# Patient Record
Sex: Male | Born: 1935 | Race: White | Hispanic: No | Marital: Married | Smoking: Never smoker
Health system: Southern US, Community
[De-identification: ages and names within clinical notes are randomized; demographics above are authoritative.]

## PROBLEM LIST (undated history)

## (undated) DIAGNOSIS — K219 Gastro-esophageal reflux disease without esophagitis: Secondary | ICD-10-CM

## (undated) DIAGNOSIS — E039 Hypothyroidism, unspecified: Secondary | ICD-10-CM

## (undated) DIAGNOSIS — I219 Acute myocardial infarction, unspecified: Secondary | ICD-10-CM

## (undated) DIAGNOSIS — E785 Hyperlipidemia, unspecified: Secondary | ICD-10-CM

## (undated) DIAGNOSIS — I2510 Atherosclerotic heart disease of native coronary artery without angina pectoris: Secondary | ICD-10-CM

## (undated) DIAGNOSIS — I10 Essential (primary) hypertension: Secondary | ICD-10-CM

## (undated) DIAGNOSIS — Z8551 Personal history of malignant neoplasm of bladder: Secondary | ICD-10-CM

## (undated) HISTORY — DX: Essential (primary) hypertension: I10

## (undated) HISTORY — PX: CYSTOURETHROSCOPY: SHX476

## (undated) HISTORY — DX: Gastro-esophageal reflux disease without esophagitis: K21.9

## (undated) HISTORY — PX: BLADDER SURGERY: SHX569

## (undated) HISTORY — DX: Atherosclerotic heart disease of native coronary artery without angina pectoris: I25.10

## (undated) HISTORY — DX: Acute myocardial infarction, unspecified: I21.9

## (undated) HISTORY — DX: Personal history of malignant neoplasm of bladder: Z85.51

## (undated) HISTORY — PX: LIVER BIOPSY: SHX301

## (undated) HISTORY — DX: Hypothyroidism, unspecified: E03.9

## (undated) HISTORY — PX: COLONOSCOPY: SHX174

## (undated) HISTORY — PX: CORONARY STENT PLACEMENT: SHX1402

## (undated) HISTORY — PX: TONSILLECTOMY: SHX5217

## (undated) HISTORY — DX: Hyperlipidemia, unspecified: E78.5

## (undated) HISTORY — PX: ANKLE SURGERY: SHX546

---

## 1998-06-13 ENCOUNTER — Ambulatory Visit (HOSPITAL_COMMUNITY): Admission: RE | Admit: 1998-06-13 | Discharge: 1998-06-13 | Payer: Self-pay | Admitting: Neurology

## 1998-06-13 ENCOUNTER — Encounter: Payer: Self-pay | Admitting: Neurology

## 1999-05-22 ENCOUNTER — Emergency Department (HOSPITAL_COMMUNITY): Admission: EM | Admit: 1999-05-22 | Discharge: 1999-05-22 | Payer: Self-pay | Admitting: Emergency Medicine

## 1999-05-22 ENCOUNTER — Encounter: Payer: Self-pay | Admitting: Emergency Medicine

## 2000-02-04 ENCOUNTER — Ambulatory Visit (HOSPITAL_COMMUNITY): Admission: RE | Admit: 2000-02-04 | Discharge: 2000-02-04 | Payer: Self-pay | Admitting: Internal Medicine

## 2001-02-26 ENCOUNTER — Ambulatory Visit (HOSPITAL_COMMUNITY): Admission: RE | Admit: 2001-02-26 | Discharge: 2001-02-26 | Payer: Self-pay | Admitting: Gastroenterology

## 2001-02-26 ENCOUNTER — Encounter: Payer: Self-pay | Admitting: Gastroenterology

## 2001-03-29 ENCOUNTER — Encounter: Payer: Self-pay | Admitting: Gastroenterology

## 2001-03-29 ENCOUNTER — Encounter (INDEPENDENT_AMBULATORY_CARE_PROVIDER_SITE_OTHER): Payer: Self-pay | Admitting: Specialist

## 2001-03-29 ENCOUNTER — Ambulatory Visit (HOSPITAL_COMMUNITY): Admission: RE | Admit: 2001-03-29 | Discharge: 2001-03-29 | Payer: Self-pay | Admitting: Gastroenterology

## 2001-04-08 ENCOUNTER — Emergency Department (HOSPITAL_COMMUNITY): Admission: EM | Admit: 2001-04-08 | Discharge: 2001-04-08 | Payer: Self-pay | Admitting: Emergency Medicine

## 2003-04-26 ENCOUNTER — Inpatient Hospital Stay (HOSPITAL_COMMUNITY): Admission: EM | Admit: 2003-04-26 | Discharge: 2003-04-29 | Payer: Self-pay | Admitting: Emergency Medicine

## 2003-11-09 ENCOUNTER — Ambulatory Visit (HOSPITAL_COMMUNITY): Admission: RE | Admit: 2003-11-09 | Discharge: 2003-11-09 | Payer: Self-pay | Admitting: Urology

## 2003-11-09 ENCOUNTER — Encounter (INDEPENDENT_AMBULATORY_CARE_PROVIDER_SITE_OTHER): Payer: Self-pay | Admitting: Specialist

## 2007-05-04 ENCOUNTER — Ambulatory Visit (HOSPITAL_COMMUNITY): Admission: RE | Admit: 2007-05-04 | Discharge: 2007-05-04 | Payer: Self-pay | Admitting: Internal Medicine

## 2007-05-05 ENCOUNTER — Inpatient Hospital Stay (HOSPITAL_COMMUNITY): Admission: EM | Admit: 2007-05-05 | Discharge: 2007-05-11 | Payer: Self-pay | Admitting: Cardiology

## 2007-05-05 ENCOUNTER — Ambulatory Visit: Payer: Self-pay | Admitting: Cardiology

## 2007-05-27 ENCOUNTER — Encounter (HOSPITAL_COMMUNITY): Admission: RE | Admit: 2007-05-27 | Discharge: 2007-08-25 | Payer: Self-pay | Admitting: Cardiology

## 2007-05-28 ENCOUNTER — Ambulatory Visit: Payer: Self-pay | Admitting: Cardiology

## 2007-06-30 ENCOUNTER — Ambulatory Visit: Payer: Self-pay | Admitting: Cardiology

## 2007-10-19 ENCOUNTER — Ambulatory Visit: Payer: Self-pay | Admitting: Cardiology

## 2007-10-22 ENCOUNTER — Ambulatory Visit: Payer: Self-pay

## 2007-10-22 LAB — CONVERTED CEMR LAB
ALT: 23 units/L (ref 0–53)
AST: 20 units/L (ref 0–37)
Albumin: 3.7 g/dL (ref 3.5–5.2)
Alkaline Phosphatase: 82 units/L (ref 39–117)
Bilirubin, Direct: 0.1 mg/dL (ref 0.0–0.3)
Cholesterol: 149 mg/dL (ref 0–200)
HDL: 41 mg/dL (ref >39.0)
LDL Cholesterol: 83 mg/dL (ref 0–99)
Total Bilirubin: 0.8 mg/dL (ref 0.3–1.2)
Total CHOL/HDL Ratio: 3.6
Total Protein: 6.6 g/dL (ref 6.0–8.3)
Triglycerides: 124 mg/dL (ref 0–149)
VLDL: 25 mg/dL (ref 0–40)

## 2007-11-02 ENCOUNTER — Ambulatory Visit: Payer: Self-pay | Admitting: Cardiology

## 2007-11-03 ENCOUNTER — Ambulatory Visit: Payer: Self-pay | Admitting: Cardiology

## 2007-11-23 ENCOUNTER — Ambulatory Visit: Payer: Self-pay | Admitting: Cardiology

## 2008-02-09 ENCOUNTER — Observation Stay (HOSPITAL_COMMUNITY): Admission: EM | Admit: 2008-02-09 | Discharge: 2008-02-10 | Payer: Self-pay | Admitting: Emergency Medicine

## 2008-02-09 ENCOUNTER — Ambulatory Visit: Payer: Self-pay | Admitting: Internal Medicine

## 2008-02-11 ENCOUNTER — Ambulatory Visit: Payer: Self-pay

## 2008-02-23 ENCOUNTER — Ambulatory Visit: Payer: Self-pay | Admitting: Cardiology

## 2008-04-10 ENCOUNTER — Ambulatory Visit: Payer: Self-pay | Admitting: Cardiology

## 2008-07-25 ENCOUNTER — Encounter: Admission: RE | Admit: 2008-07-25 | Discharge: 2008-07-25 | Payer: Self-pay | Admitting: Occupational Medicine

## 2008-09-27 ENCOUNTER — Ambulatory Visit: Payer: Self-pay | Admitting: Cardiology

## 2008-09-27 DIAGNOSIS — Z8551 Personal history of malignant neoplasm of bladder: Secondary | ICD-10-CM

## 2008-09-27 DIAGNOSIS — E039 Hypothyroidism, unspecified: Secondary | ICD-10-CM | POA: Insufficient documentation

## 2008-09-27 DIAGNOSIS — J45909 Unspecified asthma, uncomplicated: Secondary | ICD-10-CM | POA: Insufficient documentation

## 2008-09-27 DIAGNOSIS — I2510 Atherosclerotic heart disease of native coronary artery without angina pectoris: Secondary | ICD-10-CM | POA: Insufficient documentation

## 2008-09-27 DIAGNOSIS — K219 Gastro-esophageal reflux disease without esophagitis: Secondary | ICD-10-CM | POA: Insufficient documentation

## 2008-09-27 DIAGNOSIS — I10 Essential (primary) hypertension: Secondary | ICD-10-CM | POA: Insufficient documentation

## 2008-09-27 DIAGNOSIS — E785 Hyperlipidemia, unspecified: Secondary | ICD-10-CM

## 2008-10-02 ENCOUNTER — Telehealth: Payer: Self-pay | Admitting: Cardiology

## 2008-12-28 ENCOUNTER — Observation Stay (HOSPITAL_COMMUNITY): Admission: EM | Admit: 2008-12-28 | Discharge: 2008-12-29 | Payer: Self-pay | Admitting: Emergency Medicine

## 2008-12-28 ENCOUNTER — Telehealth: Payer: Self-pay | Admitting: Cardiology

## 2008-12-28 ENCOUNTER — Ambulatory Visit: Payer: Self-pay | Admitting: Internal Medicine

## 2009-01-08 ENCOUNTER — Ambulatory Visit: Payer: Self-pay | Admitting: Cardiology

## 2009-01-09 ENCOUNTER — Ambulatory Visit: Payer: Self-pay | Admitting: Cardiology

## 2009-01-11 LAB — CONVERTED CEMR LAB
ALT: 60 units/L — ABNORMAL HIGH (ref 0–53)
AST: 23 units/L (ref 0–37)
Albumin: 3.9 g/dL (ref 3.5–5.2)
Alkaline Phosphatase: 90 units/L (ref 39–117)
Bilirubin, Direct: 0 mg/dL (ref 0.0–0.3)
Cholesterol: 153 mg/dL (ref 0–200)
HDL: 46.1 mg/dL (ref >39.00)
LDL Cholesterol: 82 mg/dL (ref 0–99)
Total Bilirubin: 1 mg/dL (ref 0.3–1.2)
Total CHOL/HDL Ratio: 3
Total Protein: 7.1 g/dL (ref 6.0–8.3)
Triglycerides: 123 mg/dL (ref 0.0–149.0)
VLDL: 24.6 mg/dL (ref 0.0–40.0)

## 2009-02-05 ENCOUNTER — Telehealth: Payer: Self-pay | Admitting: Cardiology

## 2009-03-07 ENCOUNTER — Encounter (INDEPENDENT_AMBULATORY_CARE_PROVIDER_SITE_OTHER): Payer: Self-pay | Admitting: *Deleted

## 2009-03-14 ENCOUNTER — Ambulatory Visit: Payer: Self-pay | Admitting: Cardiology

## 2009-05-07 ENCOUNTER — Telehealth: Payer: Self-pay | Admitting: Cardiology

## 2009-05-16 ENCOUNTER — Encounter: Payer: Self-pay | Admitting: Cardiology

## 2009-07-30 ENCOUNTER — Emergency Department (HOSPITAL_COMMUNITY): Admission: EM | Admit: 2009-07-30 | Discharge: 2009-07-30 | Payer: Self-pay | Admitting: Family Medicine

## 2009-09-06 ENCOUNTER — Ambulatory Visit: Payer: Self-pay | Admitting: Cardiology

## 2010-03-20 ENCOUNTER — Ambulatory Visit: Payer: Self-pay | Admitting: Cardiology

## 2010-05-30 NOTE — Progress Notes (Signed)
Summary: speak to nurse about meds  Phone Note Call from Patient Call back at Home Phone [phone]   Caller: Patient Reason for Call: Talk to Nurse Summary of Call: request to speak to nurse about meds Initial call taken by: Migdalia Dk,  May 07, 2009 1:10 PM  Follow-up for Phone Call        pt is having a lot of trouble w/a bleeding hemroid and needs to know if its ok to hold plavix for a few days, he saw MD on Fri and they told him they can't do anything while he is on Plavix, he has a follow-up appt on Wed, pt w/DES in Feb 2009, will discuss w/Dr Tenny Craw and let pt know Meredith Staggers, RN  May 07, 2009 1:21 PM   Additional Follow-up for Phone Call Additional follow up Details #1::        Discussed w/Dr Tenny Craw, ok for pt to hold Plavix for a few days, pt is aware Meredith Staggers, RN  May 07, 2009 2:09 PM      Appended Document: speak to nurse about meds Spoke with wife in detail.  Dr. Carolynne Edouard is planning to contact us.  He was injected yesterday.  He has remained on ASA and understands not to stop both.  He has mulitple stents, and reason for DAPT study, etc., discussed.  She noted that he will call.  Based on the above note, he has held plavix.

## 2010-05-30 NOTE — Letter (Signed)
Summary: [facility] Office Note  [facility] Office Note   Imported By: Roderic Ovens 10/18/2009 10:00:56  _____________________________________________________________________  External Attachment:    Type:   Image     Comment:   External Document

## 2010-05-30 NOTE — Assessment & Plan Note (Addendum)
Summary: f4m   Visit Type:  Follow-up Primary Provider:  Dr. Jacky Kindle   History of Present Illness: 6 mth fu no cardiology complaints.  Had some congestion and cough.  Denies and chest pain, and isstaying active, and he is working up to 6 hours per day at the [employer].  No bleeding issues at present.   Current Medications (verified): 1)  Synthroid 50 Mcg Tabs (Levothyroxine Sodium) .... Take 1 Tablet By Mouth Once A Day 2)  Plavix 75 Mg Tabs (Clopidogrel Bisulfate) .... Take One Tablet By Mouth Daily 3)  Metoprolol Succinate 25 Mg Xr24h-Tab (Metoprolol Succinate) .... Take 1/2 Tablet By Mouth Two Times A Day 4)  Nasonex 50 Mcg/act Susp (Mometasone Furoate) .... As Needed 5)  Lipitor 40 Mg Tabs (Atorvastatin Calcium) .... Take One Tablet By Mouth Daily. 6)  Aspirin 81 Mg Tbec (Aspirin) .... Take One Tablet By Mouth Daily 7)  Nitroglycerin 0.4 Mg Subl (Nitroglycerin) .... One Tablet Under Tongue Every 5 Minutes As Needed For Chest Pain---May Repeat Times Three 8)  Ra Fish Oil 1000 Mg Caps (Omega-3 Fatty Acids) .... Take 1 Capsule By Mouth Once A Day 9)  Coq10 200 Mg Caps (Coenzyme Q10) .... Take 1 Capsule By Mouth Once A Day 10)  Lecithin 1200 Mg Caps (Lecithin) .... Every Other Day 11)  Multivitamins  Tabs (Multiple Vitamin) .... Take 1 Tablet By Mouth Once A Day 12)  Prilosec 20 Mg Cpdr (Omeprazole) .... Take 1 Tab Daily  Allergies (verified): No Known Drug Allergies  Comments:  Nurse/Medical Assistant: patient and i reviewed med list and stated all meds are the same we removed  pepcid and added omeprazole 20 mg daily  Past History:  Past Medical History: Last updated: 09/27/2008 CAD (ICD-414.00)-Post non-ST elevation myocardial infarction in january 2009 with stenting of the right coronary artery and drug- eluting stents placed in the left anterior descending. HYPERTENSION (ICD-401.9) HYPERLIPIDEMIA (ICD-272.4) GASTROESOPHAGEAL REFLUX DISEASE (ICD-530.81) HYPOTHYROIDISM  (ICD-244.9) NEOPLASM, MALIGNANT, BLADDER, HX OF (ICD-V10.51) ASTHMA (ICD-493.90)  Past Surgical History: Last updated: 09/27/2008  stenting of the right coronary artery and left anterior descending.   bladder surgery.   Liver biopsy.  Open reduction and internal fixation left ankle  Cystourethroscopy  tonsillectomy  Family History: Last updated: 09/27/2008  Both of his parents were in their mid 66s when they   died, neither one had heart disease.  He does have 1 brother who   developed heart disease after the age of 38.      Social History: Last updated: 09/27/2008  He lives in [place] with his wife and son.  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 He has no history of alcohol, tobacco or drug abuse.     Vital Signs:  Patient profile:   77 year old male Weight:      189 pounds BMI:     27.22 Pulse rate:   66 / minute BP sitting:   124 / 78  (right arm)  Vitals Entered By: Dreama Saa, CNA (March 20, 2010 3:17 PM)  Physical Exam  General:  Well developed, well nourished, in no acute distress. Head:  normocephalic and atraumatic Eyes:  PERRLA/EOM intact; conjunctiva and lids normal. Lungs:  Clear bilaterally to auscultation and percussion. Heart:  PMI non displaced.  Normal S1 and S2.  No murmur. Abdomen:  Bowel sounds positive; abdomen soft and non-tender without masses, organomegaly, or hernias noted. No hepatosplenomegaly. Pulses:  pulses normal in all 4 extremities Extremities:  No clubbing or cyanosis. Neurologic:  Alert and oriented x 3.   EKG  Procedure date:  03/20/2010  Findings:      NSR.  WNL.    Impression & Recommendations:  Problem # 1:  CAD (ICD-414.00) currently stable.  Remains on DAPT without difficulty.  Would not make changes at present time.  Will need to review use of omeprazole.  His updated medication list for this problem includes:    Plavix 75 Mg Tabs (Clopidogrel  bisulfate) .Marland Kitchen... Take one tablet by mouth daily    Metoprolol Succinate 25 Mg Xr24h-tab (Metoprolol succinate) .Marland Kitchen... Take 1/2 tablet by mouth two times a day    Aspirin 81 Mg Tbec (Aspirin) .Marland Kitchen... Take one tablet by mouth daily    Nitroglycerin 0.4 Mg Subl (Nitroglycerin) ..... One tablet under tongue every 5 minutes as needed for chest pain---may repeat times three  Problem # 2:  HYPERLIPIDEMIA (ICD-272.4) remains on lipid, lowering therapy. His updated medication list for this problem includes:    Lipitor 40 Mg Tabs (Atorvastatin calcium) .Marland Kitchen... Take one tablet by mouth daily.  Problem # 3:  HYPERTENSION (ICD-401.9) controlled at present.  His updated medication list for this problem includes:    Metoprolol Succinate 25 Mg Xr24h-tab (Metoprolol succinate) .Marland Kitchen... Take 1/2 tablet by mouth two times a day    Aspirin 81 Mg Tbec (Aspirin) .Marland Kitchen... Take one tablet by mouth daily  Patient Instructions: 1)  Your physician wants you to follow-up in:ONE YEAR   You will receive a reminder letter in the mail two months in advance. If you don't receive a letter, please call our office to schedule the follow-up appointment.  Appended Document: f4m ECG NSR, WNL.

## 2010-05-30 NOTE — Assessment & Plan Note (Signed)
Summary: 6 mo f/u   Visit Type:  6 months follow up Primary Provider:  Dr. Jacky Kindle  CC:  No complains.  History of Present Illness: Still working and doing well at present.  No chest pain.    Current Medications (verified): 1)  Synthroid 50 Mcg Tabs (Levothyroxine Sodium) .... Take 1 Tablet By Mouth Once A Day 2)  Plavix 75 Mg Tabs (Clopidogrel Bisulfate) .... Take One Tablet By Mouth Daily 3)  Metoprolol Succinate 25 Mg Xr24h-Tab (Metoprolol Succinate) .... Take 1/2 Tablet By Mouth Two Times A Day 4)  Nasonex 50 Mcg/act Susp (Mometasone Furoate) .... As Needed 5)  Lipitor 40 Mg Tabs (Atorvastatin Calcium) .... Take One Tablet By Mouth Daily. 6)  Aspirin 81 Mg Tbec (Aspirin) .... Take One Tablet By Mouth Daily 7)  Nitroglycerin 0.4 Mg Subl (Nitroglycerin) .... One Tablet Under Tongue Every 5 Minutes As Needed For Chest Pain---May Repeat Times Three 8)  Pepcid Complete 10-800-165 Mg Chew (Famotidine-Ca Carb-Mag Hydrox) .... As Needed 9)  Ra Fish Oil 1000 Mg Caps (Omega-3 Fatty Acids) .... Take 1 Capsule By Mouth Once A Day 10)  Coq10 200 Mg Caps (Coenzyme Q10) .... Take 1 Capsule By Mouth Once A Day 11)  Lecithin 1200 Mg Caps (Lecithin) .... Every Other Day 12)  Multivitamins  Tabs (Multiple Vitamin) .... Take 1 Tablet By Mouth Once A Day  Allergies (verified): No Known Drug Allergies  Vital Signs:  Patient profile:   77 year old male Height:      70 inches Weight:      188 pounds BMI:     27.07 Pulse rate:   60 / minute Pulse rhythm:   regular Resp:     18 per minute BP sitting:   131 / 77  (left arm) Cuff size:   large  Vitals Entered By: Vikki Ports (Sep 06, 2009 3:48 PM)  Physical Exam  General:  Well developed, well nourished, in no acute distress. Head:  normocephalic and atraumatic Eyes:  PERRLA/EOM intact; conjunctiva and lids normal. Lungs:  Clear bilaterally to auscultation and percussion. Heart:  PMI non displaced. Normal S1 and S2.  Soft S4 gallop.  No  murmur. Abdomen:  Bowel sounds positive; abdomen soft and non-tender without masses, organomegaly, or hernias noted. No hepatosplenomegaly. Extremities:  No clubbing or cyanosis. Neurologic:  Alert and oriented x 3.   EKG  Procedure date:  09/06/2009  Findings:      NSR. WNL  Impression & Recommendations:  Problem # 1:  CAD (ICD-414.00) Remains stable.  Current status of DAPT discussed.  Tolerating well.  Given anatomy, favor continued. His updated medication list for this problem includes:    Plavix 75 Mg Tabs (Clopidogrel bisulfate) .Marland Kitchen... Take one tablet by mouth daily    Metoprolol Succinate 25 Mg Xr24h-tab (Metoprolol succinate) .Marland Kitchen... Take 1/2 tablet by mouth two times a day    Aspirin 81 Mg Tbec (Aspirin) .Marland Kitchen... Take one tablet by mouth daily    Nitroglycerin 0.4 Mg Subl (Nitroglycerin) ..... One tablet under tongue every 5 minutes as needed for chest pain---may repeat times three  Orders: EKG w/ Interpretation (93000)  Problem # 2:  HYPERTENSION (ICD-401.9) Controlled at present.  Managed by primary physician. His updated medication list for this problem includes:    Metoprolol Succinate 25 Mg Xr24h-tab (Metoprolol succinate) .Marland Kitchen... Take 1/2 tablet by mouth two times a day    Aspirin 81 Mg Tbec (Aspirin) .Marland Kitchen... Take one tablet by mouth daily  Orders: EKG w/  Interpretation (93000)  Problem # 3:  HYPERLIPIDEMIA (ICD-272.4) Managed nicely by primary MD.  His updated medication list for this problem includes:    Lipitor 40 Mg Tabs (Atorvastatin calcium) .Marland Kitchen... Take one tablet by mouth daily.  Orders: EKG w/ Interpretation (93000)  Patient Instructions: 1)  Your physician recommends that you continue on your current medications as directed. Please refer to the Current Medication list given to you today. 2)  Your physician wants you to follow-up in:   6 MONTHS. You will receive a reminder letter in the mail two months in advance. If you don't receive a letter, please call our  office to schedule the follow-up appointment.

## 2010-07-17 ENCOUNTER — Other Ambulatory Visit: Payer: Self-pay | Admitting: Cardiology

## 2010-07-25 ENCOUNTER — Other Ambulatory Visit: Payer: Self-pay | Admitting: Cardiology

## 2010-08-02 LAB — LIPID PANEL
Cholesterol: 145 mg/dL (ref 0–200)
HDL: 43 mg/dL (ref >39)
LDL Cholesterol: 66 mg/dL (ref 0–99)
Total CHOL/HDL Ratio: 3.4 RATIO
Triglycerides: 178 mg/dL — ABNORMAL HIGH (ref <150)
VLDL: 36 mg/dL (ref 0–40)

## 2010-08-02 LAB — COMPREHENSIVE METABOLIC PANEL
ALT: 60 U/L — ABNORMAL HIGH (ref 0–53)
AST: 40 U/L — ABNORMAL HIGH (ref 0–37)
Albumin: 3.6 g/dL (ref 3.5–5.2)
Alkaline Phosphatase: 85 U/L (ref 39–117)
BUN: 16 mg/dL (ref 6–23)
CO2: 25 mEq/L (ref 19–32)
Calcium: 9.1 mg/dL (ref 8.4–10.5)
Chloride: 104 mEq/L (ref 96–112)
Creatinine, Ser: 1.08 mg/dL (ref 0.4–1.5)
GFR calc Af Amer: >60 mL/min (ref >60)
GFR calc non Af Amer: >60 mL/min (ref >60)
Glucose, Bld: 164 mg/dL — ABNORMAL HIGH (ref 70–99)
Potassium: 4.5 mEq/L (ref 3.5–5.1)
Sodium: 137 mEq/L (ref 135–145)
Total Bilirubin: 0.9 mg/dL (ref 0.3–1.2)
Total Protein: 6.2 g/dL (ref 6.0–8.3)

## 2010-08-02 LAB — DIFFERENTIAL
Basophils Absolute: 0 10*3/uL (ref 0.0–0.1)
Basophils Relative: 0 % (ref 0–1)
Eosinophils Absolute: 0.2 10*3/uL (ref 0.0–0.7)
Eosinophils Relative: 2 % (ref 0–5)
Lymphocytes Relative: 11 % — ABNORMAL LOW (ref 12–46)
Lymphs Abs: 1.1 10*3/uL (ref 0.7–4.0)
Monocytes Absolute: 0.6 10*3/uL (ref 0.1–1.0)
Monocytes Relative: 6 % (ref 3–12)
Neutro Abs: 7.5 10*3/uL (ref 1.7–7.7)
Neutrophils Relative %: 80 % — ABNORMAL HIGH (ref 43–77)

## 2010-08-02 LAB — CK TOTAL AND CKMB (NOT AT ARMC)
CK, MB: 1.5 ng/mL (ref 0.3–4.0)
CK, MB: 1.6 ng/mL (ref 0.3–4.0)
CK, MB: 1.7 ng/mL (ref 0.3–4.0)
Relative Index: 1.6 (ref 0.0–2.5)
Relative Index: INVALID (ref 0.0–2.5)
Relative Index: INVALID (ref 0.0–2.5)
Total CK: 106 U/L (ref 7–232)
Total CK: 70 U/L (ref 7–232)
Total CK: 78 U/L (ref 7–232)

## 2010-08-02 LAB — CBC
HCT: 42.7 % (ref 39.0–52.0)
Hemoglobin: 14.4 g/dL (ref 13.0–17.0)
MCHC: 33.7 g/dL (ref 30.0–36.0)
MCV: 91 fL (ref 78.0–100.0)
Platelets: 148 10*3/uL — ABNORMAL LOW (ref 150–400)
RBC: 4.7 MIL/uL (ref 4.22–5.81)
RDW: 13.6 % (ref 11.5–15.5)
WBC: 9.4 10*3/uL (ref 4.0–10.5)

## 2010-08-02 LAB — PROTIME-INR
INR: 0.9 (ref 0.00–1.49)
Prothrombin Time: 12.1 seconds (ref 11.6–15.2)

## 2010-08-02 LAB — TROPONIN I
Troponin I: 0.01 ng/mL (ref 0.00–0.06)
Troponin I: 0.02 ng/mL (ref 0.00–0.06)
Troponin I: 0.02 ng/mL (ref 0.00–0.06)

## 2010-08-02 LAB — APTT: aPTT: 30 seconds (ref 24–37)

## 2010-09-10 NOTE — Discharge Summary (Signed)
NAME:  Adrian Jensen, HENINGER                ACCOUNT NO.:  [id number]   MEDICAL RECORD NO.:  [id number]          PATIENT TYPE:  INP   LOCATION:  6532                         FACILITY:  [facility]   PHYSICIAN:  Veverly Fells. Excell Seltzer, MD  DATE OF BIRTH:  1932-08-07   DATE OF ADMISSION:  05/05/2007  DATE OF DISCHARGE:  05/11/2007                               DISCHARGE SUMMARY   PRIMARY CARE PHYSICIAN:  Geoffry Paradise, M.D.  Cardiologist, Dr. Shawnie Pons.   DISCHARGE DIAGNOSES:  1. Acute inferior ST-segment elevation myocardial infarction.  2. Coronary artery disease.  3. Hypertension.  4. Hyperlipidemia.  5. Gastroesophageal reflux disease.  6. Asthma.  7. Seasonal allergies.  8. Hypothyroidism.  9. Remote history of polio.  10.History of bladder cancer.   ALLERGIES:  NO KNOWN DRUG ALLERGIES.   PROCEDURES:  1. Left heart cardiac catheterization with successful percutaneous      coronary intervention and stenting of the proximal mid and distal      right coronary artery with placement of three Vision bare metal      stents.  2. Percutaneous coronary intervention and stenting of the left      anterior descending with placement of a 2.5 x 18-mm, Promus drug-      eluting stent.  3. Percutaneous coronary intervention and stenting of the ramus with a      2.5 x 18-mm, Promus thrombus drug-eluting stent.   HISTORY OF PRESENT ILLNESS:  A 77 year old, married, Caucasian male with  the above problem list.  He was in his usual state of health until  May 04, 2007, when he had some chest pain upon awakening.  He was  evaluated by his primary care Adrian Jensen and subsequently scheduled for a  chest and abdominal CT which did not show any acute abnormalities.  Mr.  Jensen had recurrent chest discomfort on January 7, and upon seeing Dr.  Jacky Kindle on that day, he was noted to have inferior ST-segment elevation.  EMS was promptly called and the patient was taken emergently to the  [facility] Cardiac Cath  Lab for evaluation.   HOSPITAL COURSE:  Mr. Frazer underwent emergent cardiac catheterization  in the setting of an acute inferior ST-segment elevation MI.  Catheterization revealed significant multivessel disease including a 95%  stenosis in the proximal RCA with a total occlusion of the distal RCA  had a 70% ostial.  He was also noted to have significant 90% stenoses in  the proximal LAD and proximal ramus intermedius.  EF was 45% with  inferior hypokinesis.  In the setting of inferior MI, the right coronary  artery was stented with three separate Vision bare metal stents.  The  patient tolerated this procedure well and was monitored in the coronary  intensive care unit where he was maintained on aspirin and Plavix  therapy and, for awhile, beta-blocker and statin were initiated.  Following film review with Dr. Excell Seltzer, Dr. Juanda Chance and Dr. Riley Kill, the  decision was made to pursue PCI of the residual LAD and ramus disease.  The patient had no recurrent chest discomfort over the weekend  and  tolerated his medications well.  Unfortunately, we were unable to  initiate ACE inhibitor therapy because of intermittent hypotension on  beta-blocker alone.  On January 12, the patient was taken back to the  catheterization lab where he underwent successful PCI and stenting of  the LAD and ramus as outlined above.  He tolerated this procedure well  and postprocedure has been ambulating without recurrent discomfort or  limitations.  Will plan to discharge him home today in satisfactory  condition.   DISCHARGE LABORATORY DATA AND X-RAY FINDINGS:  Hemoglobin 13.3,  hematocrit 39.2, WBC 9.0, platelets 226, INR 0.9.  Sodium 135, potassium  5.5 (hemolyzed), chloride 104, CO2 25, BUN 14, creatinine 1.03, glucose  94.  Total bilirubin 0.8, alkaline phosphatase 52, AST 29, ALT 24, total  protein 6.0.  Albumin 2.9, calcium 8.2.  Peak CK 988, peak MB 103.3,  peak troponin-I 20.49.  Total cholesterol 180,  triglycerides 60, HDL 39,  LDL 127.  TSH 0.688.   DISPOSITION:  The patient is being discharged home today in good  condition.   FOLLOWUP:  We have arranged for followup with Dr. Riley Kill on January 30,  at 4:15 p.m.  He is asked to follow up with Dr. Jacky Kindle as previously  scheduled.   DISCHARGE MEDICATIONS:  1. Toprol XL 25 mg daily.  2. Plavix 75 mg daily.  3. Lipitor 80 mg nightly.  4. Synthroid 50 mcg daily.  5. Aspirin 325 mg daily.  6. Prilosec OTC 20 mg daily.  7. Nitroglycerin 0.4 mg sublingual p.r.n. chest pain.   OUTSTANDING LAB STUDIES:  None.   Duration of discharge encounter 45 minutes including physician time.      Nicolasa Ducking, ANP      Veverly Fells. Excell Seltzer, MD  Electronically Signed    CB/MEDQ  D:  05/11/2007  T:  05/11/2007  Job:  [id number]   cc:   Geoffry Paradise, M.D.

## 2010-09-10 NOTE — Cardiovascular Report (Signed)
NAME:  Adrian Jensen, Adrian Jensen                ACCOUNT NO.:  [id number]   MEDICAL RECORD NO.:  [id number]          PATIENT TYPE:  OIB   LOCATION:  2902                         FACILITY:  [facility]   PHYSICIAN:  Arturo Morton. 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  He ended up having a CT scan, but the pain  recurred, and he was brought emergently to the office.  He was seen by  Dr. Jacky Kindle and then referred for urgent evaluation.  EKG was suggestive  of inferior wall infarction.   PROCEDURES:  1. Left heart catheterization.  2. Selective coronary arteriography.  3. Selective left ventriculography.  4. PTCA and stenting of the right coronary artery x3.   DESCRIPTION OF PROCEDURE:  The patient was brought to the  catheterization laboratory and prepped and draped in the usual fashion.  Through an anterior puncture, the femoral artery was entered.  A 6-  French sheath was placed.  I-stat laboratories were obtained to monitor  creatinine and hematocrit.  Bivalirudin was given according to protocol.  Views of the left and right coronaries were then obtained.  There was  evidence of an acute occlusion of the distal right coronary.  With this  in mind, preparations were made for urgent intervention.  A JR-4 guiding  catheter with side holes was utilized.  Bivalirudin had been given and a  CT checked.  Oral clopidogrel 600 mg was also administered.  The right  coronary artery was then engaged using a traverse wire.  The probe  ballooned down distally, and we were able to cross into the distal  vessel.  Balloon dilatations were done distally which allowed flow to be  restored.  This demonstrated high-grade disease in the distal right  coronary, and there was also high-grade mid stenosis as well as proximal  lesion.  There were pseudo stenoses noted in the acute margin due to  tortuosity of the vessels.   The distal vessel was then dilated.  We were then able to place an  18x2.5 mini Vision stent.  This was deployed at 15 atmospheres.  Later  on in the case, we were not able to get a noncompliant balloon down  across the acute margin.  The mid lesion was crossed with a 275x15  Vision stent.  This was taken up to 15 atmospheres.  The proximal lesion  was crossed with a 12x275 Vision stent, and this was taken up to 15  atmospheres.  Post dilatations were then done using a 3-mm Quantum  Maverick balloon in both proximal and mid lesions.  The distal lesion  appeared to be an oversized stent and had been deployed at high  pressure.  Because of the difficulty getting a short noncompliant  balloon down around the corner, post dilatation was not done at the  distal stenosis.  Central aortic and left ventricular pressures were  then measured with pigtail, and ventriculography was then  performed in  the RAO projection.  Overall, the patient tolerated the procedure well,  and symptoms were improved after percutaneous intervention.   The femoral sheath was sewn into place.  He was put in the holding area.   HEMODYNAMIC DATA:  1. Central aortic pressure 106/79.  2. LV pressure 128/17.  3. There was no gradient pullback across aortic valve.   ANGIOGRAPHIC DATA:  1. The right coronary artery demonstrates marked tortuosity and a      fairly significantly diseased vessel.  There is 70% narrowing      proximally followed by 95% mid stenosis.  The distal vessel is      totally occluded just proximal to the posterior descending branch.      Following stenting, the proximal lesion was reduced to 0%, mid      lesion to 0%, and the distal lesion which was totally occluded was      reduced to 0% as well.  There did not appear to be edge tear.  This      filled into a fairly large posterolateral system.  This  had two      branches.  There is a small bifurcating posterior descending branch      that comes out in the middle of the stent.  There is some pinching      at the takeoff of this, but there is TIMI III flow.  2. The left main has about 30% distal narrowing.  3. The left anterior descending artery courses to the apex and has      diffuse luminal irregularity.  There is 30% stenosis before the      diagonal and then 90% just proximal to the septal perforating      vessel.  Distally, there is 40-50% stenosis.  There is a fairly      short discrete focal stenosis of 90% in the diagonal as well.  4. The ramus intermedius has 40% segmental proximal narrowing and 90%      mid stenosis.  5. The AV circumflex has about 70% narrowing but is a small-caliber      vessel.   Ventriculography in the RAO projection reveals ejection fraction of 45-  50% with inferior hypokinesis and mild mitral regurgitation.   CONCLUSIONS:  1. Acute inferior wall myocardial infarction treated with primary      stenting x3 with non drug-eluting platforms.  2. Residual high-grade disease involving the LAD, ramus intermedius      and diagonal.  Long-term strategy will be discussed.  I will review      the films with Dr. Excell Seltzer.      Arturo Morton. Riley Kill, MD, [facility]  Electronically Signed     TDS/MEDQ  D:  05/05/2007  T:  05/06/2007  Job:  [id number]   cc:   Veverly Fells. Excell Seltzer, MD  Geoffry Paradise, M.D.

## 2010-09-10 NOTE — H&P (Signed)
NAME:  Adrian Jensen, Adrian Jensen NO.:  [id number]   MEDICAL RECORD NO.:  [id number]          PATIENT TYPE:  OBV   LOCATION:  2015                         FACILITY:  [facility]   PHYSICIAN:  Hillis Range, Jensen       DATE OF BIRTH:  25-Oct-1932   DATE OF ADMISSION:  02/09/2008  DATE OF DISCHARGE:                              HISTORY & PHYSICAL   PRIMARY CARE PHYSICIAN:  Adrian Jensen   PRIMARY CARDIOLOGIST:  Adrian Jensen, [facility].   CHIEF COMPLAINT:  Chest pain.   HISTORY OF PRESENT ILLNESS:  Adrian Jensen is a 77 year old male with a  history of coronary artery disease who had onset of substernal chest  pain at approximately 5:45 a.m. shortly after he got up.  He did not try  any medications and considered nitroglycerin, but did not use it.  His  symptoms resolved spontaneously in about 45 minutes.  Per his wife, he  was pale and the patient admits to diaphoresis, but denies shortness of  breath or nausea and vomiting.  The symptoms reminded him of his non-ST  segment elevation MI obtained in January 2009, but it was not that bad.  His symptoms reached to 4/10.  They have not recurred since they  initially resolved this morning by approximately 6:30.  This is the  first episode of chest pain he has had since his MI.  He describes his  chest pain as a tightness along the lower chest.   PAST MEDICAL HISTORY:  1. Non-ST segment elevation MI in January 2009 with bare-metal stent      to the RCA x3 for the culprit lesion and staged intervention with 2      drug-eluting stents to the LAD 5 days later, EF preserved.  2. Hypertension.  3. Hyperlipidemia.  4. Gastroesophageal reflux disease.  5. Hypothyroidism.  6. History of bladder cancer.  7. Remote history of polio as a teenager.   PAST SURGICAL HISTORY:  He is status post bladder surgery, cardiac  catheterization, tonsillectomy, and left ankle surgery.   ALLERGIES:  No known drug allergies.   CURRENT MEDICATIONS:  1.  Aspirin 81 mg a day.  2. Plavix 75 mg a day.  3. Lipitor 80 mg a day.  4. Synthroid 50 mcg daily.  5. Prilosec 20 mg daily p.r.n.   SOCIAL HISTORY:  He lives in [place] with his wife and son.  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  He has no history of alcohol, tobacco, or drug abuse.   FAMILY HISTORY:  Both of his parents were in their mid 52s when they  died, neither one had heart disease.  He does have 1 brother who  developed heart disease after the age of 87.   REVIEW OF SYSTEMS:  He has had some orthostatic dizziness recently.  The  diaphoresis and chest pain are described above.  He has some mild  chronic dyspnea on exertion, but no recent changes.  He has only rare  arthralgias.  He rarely has  reflux symptoms since he made some dietary  changes.  Full 14-point review of systems is otherwise negative.   PHYSICAL EXAMINATION:  VITAL SIGNS:  Temperature is 98.8, blood pressure  177/87, pulse 74, respiratory rate 16, O2 saturation 98% on room air.  GENERAL:  He is a well-developed, elderly white male, in no acute  distress.  HEENT:  Normal.  NECK:  There is no lymphadenopathy, thyromegaly, bruit, or JVD noted.  CV:  His heart is regular rate and rhythm with an S1 and S2 and no  significant murmur, rub, or gallop is noted.  Distal pulses are intact  in all 4 extremities.  LUNGS:  Clear to auscultation bilaterally.  SKIN:  No rashes or lesions are noted.  ABDOMEN:  Soft and nontender with active bowel sounds.  No  hepatosplenomegaly by palpation.  EXTREMITIES:  There is no cyanosis, clubbing, or edema noted.  MUSCULOSKELETAL:  There is no joint deformity or effusion, and no spine  or CVA tenderness.  NEURO:  He is alert and oriented.  Cranial nerves II through XII grossly  intact.   Chest x-ray shows mild hyperinflation with no infiltrate or edema.   EKG, sinus rhythm, rate 78 with no acute ischemic changes.    LABORATORY DATA:  Laboratory values are pending, but point of care  markers are negative x1.   IMPRESSION:  Adrian Jensen was seen today by Dr. Johney Frame.  He is a 77-year-  old male with a history of coronary artery disease diagnosed in January  2009.  He had chest pain this morning, but has now resolved.  It is  unlikely that this is an acute coronary syndrome, however, given his  history of coronary artery disease with recent percutaneous  intervention, the best choice of prudence would be to admit him and rule  him out for myocardial infarction on telemetry while increasing blood  pressure control.  He will be continued on aspirin, Plavix, and a  statin.  We will start a low-dose beta-blocker, but hold nitroglycerin  and heparin unless pain returns or he has elevated cardiac enzymes.  Dr.  Riley Jensen will see him in a.m. and decide on further evaluation at that  time.      Theodore Demark, PA-C      Hillis Range, Jensen  Electronically Signed    RB/MEDQ  D:  02/09/2008  T:  02/10/2008  Job:  [id number]

## 2010-09-10 NOTE — H&P (Signed)
NAME:  ZYIERE, ROSEMOND                ACCOUNT NO.:  [id number]   MEDICAL RECORD NO.:  [id number]          PATIENT TYPE:  OIB   LOCATION:  2902                         FACILITY:  [facility]   PHYSICIAN:  Arturo Morton. Riley Kill, MD, FACCDATE OF BIRTH:  1933-01-18   DATE OF ADMISSION:  05/05/2007  DATE OF DISCHARGE:                              HISTORY & PHYSICAL   PRIMARY CARE PHYSICIAN:  Geoffry Paradise, M.D.   PRIMARY CARDIOLOGIST:  New.   CHIEF COMPLAINTS:  Chest pain.   HISTORY OF PRESENT ILLNESS:  Mr. Sobotka is a 77 year old male with no  previous history of coronary artery disease.  He was in his usual state  of health until yesterday, when he had chest pain upon waking.  He did  not have exertional symptoms.  He saw Dr. Jacky Kindle yesterday, who  evaluated him with an EKG and ordered a chest and abdominal CT, which  did not show any acute abnormality.  Today Mr. Ramsburg again woke with  chest pain.  He stated it was more severe today and reached an 08/10.  He had some shortness of breath as well as nausea but no vomiting and  some diaphoresis with this as well.  When Dr. Jacky Kindle called to tell him  the results of his CT scan, Mr. Dinneen mentioned that he was having  recurrent chest pain.  Dr. Jacky Kindle evaluated him again today and today  and today his EKG showed inferior ST elevation.  EMS was called.  Between EMS and Dr. Lanell Matar office, he received a total of aspirin 81  mg x4, sublingual nitroglycerin x2, morphine 4 mg.  He was transported  urgently to [facility] and taken directly to the catheterization lab.  Currently he still complains of some mild chest tightness approximately  a 2/10.  He is otherwise resting comfortably except for a headache,  which he states has been continuous for the last 3 days.   PAST MEDICAL HISTORY:  1. Hypertension.  2. Gastroesophageal reflux disease.  3. Asthma/seasonal allergies.  4. Hypothyroidism.  5. Remote history of polio.  6. History of bladder  cancer.  7. He has no history of hyperlipidemia, diabetes or family history of      premature coronary artery disease.   SURGICAL HISTORY:  He has been treated for bladder cancer as well as a  left ankle surgery after a fall and fracture.   SOCIAL HISTORY:  He lives in [place] with his wife.  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  He has no history of alcohol, tobacco  or drug abuse.   FAMILY HISTORY:  Both of his parents were in their 2s when they died,  and neither one had a history of coronary artery disease.  A brother is  alive with a history of coronary artery disease but did not develop it  until after the age of 28.   ALLERGIES:  No known drug allergies.   CURRENT MEDICATIONS:  1. Synthroid 50 mcg daily  2. Bisoprolol/HCT 2.5/6.25 mg daily.  3. Vitamin C 500 mg every other day.  4. Nutritional supplements including red yeast rice and flaxseed oil      as well as selenium, grape seed extract, alpha lipoic acid,      Spirulina, carotene and vitamin B.  5. Prilosec OTC daily.   REVIEW OF SYSTEMS:  He denies fevers or chills.  He has had some  diaphoresis.  He has had a headache for the last few days.  The chest  pain is described as a tightness or pressure and is in the central part  of his chest.  He has had shortness of breath with this.  He has no  dyspnea on exertion, orthopnea, PND, edema or palpitations.  He has had  no syncope or presyncope and does not cough or wheeze.  He denies reflux  symptoms, abdominal pain or melena.  A full 14-point review of systems  is otherwise negative.   PHYSICAL EXAM:  GENERAL:  He is a well-developed, elderly white male in  no acute distress.  HEENT:  Normal.  NECK:  There is no lymphadenopathy, thyromegaly, bruit or JVD noted.  CV:  His heart is regular rate and rhythm with an S1-S2 and no  significant murmur, rub or gallop is noted.  Distal  pulses are intact in  all four extremities and no femoral bruits are appreciated.  LUNGS:  Essentially clear to auscultation bilaterally.  SKIN:  No rashes or lesions are noted.  ABDOMEN:  Soft and nontender with active bowel sounds.  EXTREMITIES:  There is no cyanosis, clubbing or edema noted.  MUSCULOSKELETAL:  There is no joint deformity effusions..  NEUROLOGIC:  He is alert and oriented with cranial nerves II-XII grossly  intact.   Initial EKG shows sinus rhythm, approximately 60, with inferior ST  elevation and reciprocal lateral changes.  The ST elevation is less than  2 mm.   Laboratory values and chest x-ray are pending.   IMPRESSION:  Acute inferior ST elevation myocardial infarction:  The  patient is being taken directly to the catheterization lab.  Further  evaluation and treatment will depend on the results of the heart  catheterization.  We will check a TSH and lipid profile.  He will be  continued on his home medications.      Theodore Demark, PA-C      Arturo Morton. Riley Kill, MD, [facility]  Electronically Signed   RB/MEDQ  D:  05/05/2007  T:  05/06/2007  Job:  [id number]   cc:   Geoffry Paradise, M.D.

## 2010-09-10 NOTE — Assessment & Plan Note (Signed)
[facility]                            CARDIOLOGY OFFICE NOTE   Denvil, Canning AARIAN GRIFFIE                       MRN:          [id number]  DATE:05/28/2007                            DOB:          August 01, 1932    Mr. Veal is in for follow-up.  He presented with an inferior wall  myocardial infarction and underwent stenting.  He had three stents  placed in the right coronary artery and he had multivessel residual  disease.  He then subsequently underwent percutaneous intervention by  Dr. Calton Dach.  He had a drug-eluting stent placed in the LAD and a  drug-eluting placed in the ramus.  Since discharge from the hospital, he  has gotten along well.  He denies any ongoing chest pain.   CURRENT MEDICATIONS:  Toprol XL 25 mg daily, Plavix 75 mg daily, aspirin  325 mg daily, Synthroid 50 mcg daily, Lipitor 80 mg q.h.s. and Prilosec  over-the-counter.   PHYSICAL EXAMINATION:  The blood pressure is 134/80, the pulse is 65.  The lung fields are clear.  The cardiac exam is regular.  The abdomen is soft.  EXTREMITIES:  Reveal no edema.  The groin has healed.   The patient is in cardiac rehab with an occasional premature ventricular  contraction.   EKG reveals normal sinus rhythm essentially within normal limits.   IMPRESSION:  1. Coronary artery disease status post inferior wall myocardial      infarction.  2. Probable hypercholesterolemia.  3. Multivessel disease.   PLAN:  1. The patient will need a follow-up exercise treadmill study.  2. He will need lipid liver profile, and this will likely be done with      Dr. Jacky Kindle.     Arturo Morton. Riley Kill, MD, [facility]  Electronically Signed    TDS/MedQ  DD: 06/20/2007  DT: 06/20/2007  Job #: [id number]   cc:   Geoffry Paradise, M.D.

## 2010-09-10 NOTE — Letter (Signed)
October 19, 2007    Geoffry Paradise, M.D.  [address]  [place], [place]  [address]   RE:  GIORGI, DEBRUIN  MRN:  [id number]  /  DOB:  [birth year]   Dr. Raiford Noble:   I had the pleasure seeing your nice patient, Adrian Jensen, in the office  today in followup.  As you know, he presented with an acute coronary  syndrome.  He had 3 nondrug-eluting stents placed in the right coronary  artery.  He then underwent percutaneous stenting of the LAD and the  ramus intermedius with drug-eluting stents.  Since that time, he has  really gotten along quite well.  He does have some infrascapular pain.  His overall LV function at the time of his infarction was 45-50%.  His  scapular pain is not associated with walking long distances or with  walking in general.  He has more to do with leaning over.   CURRENT MEDICATIONS:  Plavix 75 mg daily, Lipitor 80 mg nightly,  Synthroid 50 mcg daily, Bystolic 10 mg one-half tablet daily, and  enteric-coated aspirin 81 mg daily.   On physical examination, he is alert and oriented, in no acute distress.  Blood pressure is 122/74.  The pulse was 68.  His lung fields are clear  and the cardiac rhythm is regular.  There is no significant murmur  noted.   The electrocardiogram demonstrates sinus rhythm and is otherwise within  normal limits with a rare premature ventricular contraction.   Overall, the patient is getting along well.  He has not been having any  ongoing chest pain or shortness of breath.  His scapular pain is  somewhat atypical.  We planned to have him return for a stress Myoview  sometime next month, which will be 6 months after his procedures to  better assess stent integrity.  Fortunately, the patient is now off  Prilosec.  Also, we will get a lipid and liver profile, as he has had  abnormal liver functions in the past.  His previous biopsy suggested  early changes of steatosis.   I will let you know when I see him back, and in the interim, we will  not  change any of his current medications.   Thanks for allowing me to share in his care.    Sincerely,      Arturo Morton. Riley Kill, MD, [facility]  Electronically Signed    TDS/MedQ  DD: 10/19/2007  DT: 10/20/2007  Job #: [id number]

## 2010-09-10 NOTE — Letter (Signed)
November 23, 2007    Geoffry Paradise, M.D.  [address]  [place], [place] [address]   RE:  Adrian Jensen, Adrian Jensen  MRN:  [id number]  /  DOB:  06-Jan-1933   Dear Gerlene Burdock,   I had the pleasure of seeing Adrian Jensen in the office today in a  followup visit.  In general, he is doing quite well.  He does have a  little bit of back discomfort, and he relates some of this to polio.  It  does not sound ischemic and is nothing like what he had at the time of  his acute infarction.  He had a sinus infection as you know, and has  improved after antibiotics.  His discomfort occurs predominantly under  the shoulder blade.  He denies any ongoing active symptoms.  As you  know, he underwent stenting of the right coronary artery in the setting  of an inferior wall infarction, and had drug-eluting stents plate in the  left coronary system.  He has done well overall.   CURRENT MEDICATIONS:  1. Plavix 75 mg daily.  2. Lipitor 80 mg q.h.s.  3. Synthroid 50 mcg daily.  4. Enteric-coated aspirin 81 mg daily.   PHYSICAL EXAMINATION:  He is alert and oriented.  The blood pressures 131/84, the pulse is 74.  The lung fields are clear.  The cardiac rhythm is regular.  The extremities reveal no edema.   Overall, Adrian Jensen appears to be getting along quite well.  He has done  better since he came off his blood pressure medications.  He has resumed  Lipitor.  His most recent lipid profile has revealed an LDL of 83 with  an HDL of 41.  I would recommend that you consider rechecking in about 4-  6 months.  If he has any problems in the interim, please not hesitate to  let me know.  I will be happy to see him at any time.  I hope he  continues to do well.    Sincerely,      Arturo Morton. Riley Kill, MD, [facility]  Electronically Signed    TDS/MedQ  DD: 11/23/2007  DT: 11/24/2007  Job #: [id number]

## 2010-09-10 NOTE — Procedures (Signed)
[facility]                              EXERCISE TREADMILL   NAME:[name], Adrian Jensen                       MRN:          [id number]  DATE:06/30/2007                            DOB:          Sep 25, 1932    [name] is a 77 year old white male patient, who had an inferior wall  MI, treated with three stents in his RCA in January of 2009, and also  had a drug eluting stent to the LAD, as well as the ramus.  The patient  has been having some bigeminy at cardiac rehab and is here for a  followup stress test from his MI.   Patient exercised 9 minutes 16 seconds on the Bruce protocol.  Test  stopped due to fatigue.  Initial blood pressure was 146/87, got up to  138/108, post-exercise 184/109 and down to 152/96.  Obtained peak heart  rate of 171, which was 115% of maximum predicted heart rate.  Patient  did have PVCs, some bigeminy and one three-beat run.  He had no STT-wave  changes.  Patient had good exercise tolerance and a hypertensive  response to exercise.  No ischemic EKG changes.  Occasional PVCs and  couplets.      Jacolyn Reedy, PA-C  Electronically Signed      Arturo Morton. Riley Kill, MD, [facility]  Electronically Signed   ML/MedQ  DD: 06/30/2007  DT: 06/30/2007  Job #: [id number]

## 2010-09-10 NOTE — Assessment & Plan Note (Signed)
[facility]                            CARDIOLOGY OFFICE NOTE   Adrian Jensen, Adrian Jensen                       MRN:          [id number]  DATE:02/23/2008                            DOB:          [birth year]    PRIMARY CARDIOLOGIST:  Arturo Morton. Riley Kill, MD, [facility]   PRIMARY CARE Juno Alers:  Geoffry Paradise, MD   Adrian Jensen is a 77 year old married white male patient who has a history  of coronary artery disease status post ST-elevation MI in January 2009  treated with drug-eluting stent to the LAD and ramus intermedius.  He  presented to the hospital on February 09, 2008, with some substernal  chest pain, but was not severe.  He ruled out for an MI and was  discharged home and scheduled for outpatient stress Myoview.  This was  performed on February 11, 2008, and the patient exercised 8 minutes.  No  ST changes.  He had a fixed perfusion defect in the basal to mid  inferior wall.  There was corresponding hypokinesis on the gated images  representing an infarct.  There was no evidence of ischemia.  Ejection  fraction was 50%.  This was unchanged from stress Myoview performed in  June 2009.   The patient is here today.  He says he has not had any further chest  pain.  He does complain of a headache for the past month.  He says he  has suffered headaches for years off and on, but has never had one last  this long.  He says it is a dull ache, not a severe pain.  He and his  wife are convinced it could be related to the Lipitor since she did not  tolerate it and their son-in-law did not tolerate it.  He has been on  Lipitor since January and has not had problems until the headaches began  a month ago.  He also has had some dizziness at home when he gets up  quickly and has learned to take his time moving around.  He has not had  any syncope.   CURRENT MEDICATIONS:  1. Synthroid 50 mcg daily.  2. Aspirin 81 mg daily.  3. Plavix 75 mg daily.  4. Metoprolol 25 mg  one-half b.i.d.  5. Lisinopril 5 mg daily.   PHYSICAL EXAMINATION:  GENERAL:  This is a 77 year old white male in no  acute distress.  VITAL SIGNS:  Blood pressure 138/68, orthostatics were done and he was  not orthostatic in the office, see for details.  Weight 182, pulse 73.  NECK:  Without JVD, HJR, bruit, or thyroid enlargement.  LUNGS:  Clear anterior, posterior, and lateral.  HEART:  Regular rate and rhythm at 73 beats per minute.  Normal S1 and  S2 with 1/6 systolic murmur at the left sternal border.  ABDOMEN:  Soft without organomegaly, masses, lesions, or abnormal  tenderness.  EXTREMITIES:  Without cyanosis, clubbing, or edema.  He has good distal  pulses.   IMPRESSION:  1. Chest pain, question etiology.  Stress Myoview negative for      ischemia  on February 11, 2008.  Ejection fraction 50%.  Basal      inferior hypokinesis.  2. History of coronary artery disease status post ST-elevation      myocardial infarction in January 2009 treated with stenting of the      left anterior descending and ramus intermedius.  3. Headache question etiology.  4. Dizziness, question mildly orthostatic, not orthostatic in the      office today.  5. Hypertension.  6. Hyperlipidemia.  7. Gastroesophageal reflux disease.  8. Hypothyroidism.  9. History of bladder cancer status post bladder surgery.   PLAN:  I have given the patient a prescription for Lipitor 40 mg daily.  He just ran out of his current prescription of 80 mg.  He asked if he  could try staying off it for a couple days, and I said that would be  fine and he is to call us if his headaches resolve.  We can then change  his statin.  If the headaches do not resolve, I asked him to resume  Lipitor 40 mg daily and have offered to refer him to a headache clinic,  but he has been to one in the past and does not want to pursue that at  this time.  He will see Dr. Riley Kill back in 1-2 months.      Jacolyn Reedy, PA-C   Electronically Signed      Marca Ancona, MD  Electronically Signed   ML/MedQ  DD: 02/23/2008  DT: 02/24/2008  Job #: [id number]

## 2010-09-10 NOTE — Cardiovascular Report (Signed)
NAME:  Adrian Jensen, Adrian Jensen                ACCOUNT NO.:  [id number]   MEDICAL RECORD NO.:  [id number]          PATIENT TYPE:  INP   LOCATION:  6532                         FACILITY:  [facility]   PHYSICIAN:  Veverly Fells. Excell Seltzer, MD  DATE OF BIRTH:  1933-01-14   DATE OF PROCEDURE:  05/10/2007  DATE OF DISCHARGE:                            CARDIAC CATHETERIZATION   PROCEDURE:  1. Percutaneous transluminal cardiac angioplasty and drug-eluting      stent placement in the left anterior descending.  2. Percutaneous transluminal cardiac angioplasty and drug-eluting      stent placement in the ramus.  3. StarClose of the right femoral artery.   INDICATIONS:  Adrian Jensen is a 77 year old gentleman who presented on  January 7 with an inferior-ST-elevation MI.  He was treated with primary  PCI.  This involved multiple bare metal stents in the right coronary  artery.  He had an excellent angiographic result but had severe residual  stenosis in the LAD and intermediate branch.  He also has a tight  stenosis in the diagonal branch.  Dr. Riley Kill and I have reviewed these  films carefully and have decided to stage intervention of the LAD and  ramus branch with medical therapy for the diagonal as this is a smaller  vessel.  This was reviewed with the patient in detail, and informed  consent was obtained.   The right groin was prepped, draped, and anesthetized with 1% lidocaine.  Using the modified Seldinger technique, a 6-French sheath was placed in  the right femoral artery. A 6-French CLS 3.5 cm guide catheter was used.  Angiomax was used for anticoagulation once therapeutic ACT was achieved.  A cougar guidewire was passed beyond the severe stenosis in the mid LAD  without difficulty.  There was a focal 95% eccentric stenosis followed  by a second area of the eccentric 50% stenosis.  I planned on covering  both areas as they were close in proximity.  The area was predilated  with a 2.5 x 12 mm Maverick balloon  up to 6 atmospheres.  Following  balloon dilatation, there was some improvement in the lesion appearance  but with significant residual stenosis.  I elected to treat this with a  2.5 x 18 mm PROMUS stent which was deployed at 14 atmospheres.  There is  an excellent angiographic result with a widely expanded stent. There was  a step-down off from the distal portion of the stent. The stent was then  postdilated with a 2.75 x 15 mm Quantum Maverick which was taken to 16  atmospheres and used to treat the proximal and mid portions of the  stent. The distal portion of stent was not postdilated.  There was an  excellent angiographic result, and I elected to move on to the  intermediate branch at that point.  The same Cougar guidewire was used  to wire the intermediate. The same 2.5 x 12 mm balloon was used to  predilate. The vessel was diseased back to the origin.  I elected to  stent the severe stenosis and cover back to the proximal portion of  the  vessel.  A 2.5 x 18 mm PROMUS stent was used and was deployed at 12  atmospheres.  There was excellent stent expansion.  The stent appeared  well sized for the vessel.  I elected to post dilate with a 2.5 x 15 mm  Quantum Maverick which was taken to 16 atmospheres.  The entire stented  segment was dilated.  At completion of procedure, there is an excellent  angiographic result with TIMI-3 flow throughout.  The StarClose device  was used to seal the femoral arteriotomy.  The patient tolerated the  procedure well and had no immediate complications.   ASSESSMENT:  1. Successful percutaneous transluminal cardiac angioplasty and      stenting of the proximal left anterior descending.  2. Successful percutaneous transluminal cardiac angioplasty and      stenting of the ramus intermedius.   RECOMMENDATIONS:  Adrian Jensen will be observed overnight.  We will plan  on discharge home tomorrow with a minimum of 12 months of dual  antiplatelet therapy using  aspirin and Plavix.      Veverly Fells. Excell Seltzer, MD  Electronically Signed     MDC/MEDQ  D:  05/10/2007  T:  05/10/2007  Job:  [id number]   cc:   Geoffry Paradise, M.D.  Arturo Morton. Riley Kill, MD, [facility]

## 2010-09-10 NOTE — Discharge Summary (Signed)
NAME:  Adrian Jensen, Adrian Jensen                ACCOUNT NO.:  [id number]   MEDICAL RECORD NO.:  [id number]          PATIENT TYPE:  OBV   LOCATION:  2015                         FACILITY:  [facility]   PHYSICIAN:  Verne Carrow, MDDATE OF BIRTH:  Jul 20, 1932   DATE OF ADMISSION:  02/09/2008  DATE OF DISCHARGE:  02/10/2008                               DISCHARGE SUMMARY   PRIMARY CARDIOLOGIST:  Maisie Fus D. Riley Kill, MD, [facility]   PRIMARY CARE Todd Jelinski:  Geoffry Paradise, MD   DISCHARGE DIAGNOSIS:  Chest pain.   SECONDARY DIAGNOSES:  1. Coronary artery disease status post non-ST elevation myocardial      infarction, January 2009, with stenting of the right coronary      artery and left anterior descending.  2. Hypertension.  3. Hyperlipidemia.  4. Gastroesophageal reflux disease.  5. Hypothyroidism.  6. History of bladder cancer status post bladder surgery.   ALLERGIES:  No known drug allergies.   PROCEDURES:  None.   HISTORY OF PRESENT ILLNESS:  A 77 year old Caucasian male with prior  history of CAD who was in his usual state of health upto 5:45 a.m. on  February 09, 2008, when he developed substernal chest discomfort similar  to previous angina, but not as severe.  Symptoms resolved after  approximately 45 minutes, and the patient presented to the [facility]  ED.  In the ED, ECG showed no acute changes and point-of-care cardiac  markers were negative.  He was admitted for rule out.   HOSPITAL COURSE:  Mr. Levene ruled out for MI by cardiac markers x3.  He  has had no recurrent chest discomfort and has been ambulating without  difficulty.  He will be discharged home today in good condition.  We  have arranged for him to have an exercise Myoview in our office  tomorrow, February 11, 2008, at 8:30 a.m.   DISCHARGE LABORATORIES:  Hemoglobin 14.8 hematocrit 43.7, WBCs 6.3, and  platelets 161.  INR 0.9.  Sodium 139, potassium 4.3, chloride 106, CO2  27, BUN 17, creatinine 0.90, and glucose 76.  CK  98, MB 1.5, and  troponin I 0.02.  TSH 1.945.   DISPOSITION:  The patient is being discharged home today in good  condition.   FOLLOWUP PLANS AND APPOINTMENTS:  We have arranged for him to undergo  exercise Myoview stress testing on February 11, 2008, at 8:30 a.m.  He  will follow up with Dr. Rosalyn Charters PA, [place] Bushy, on February 23, 2008, at 10:45 a.m.  He was asked to follow up with Dr. Jacky Kindle as  previously scheduled.   DISCHARGE MEDICATIONS:  1. Aspirin 325 mg daily.  2. Plavix 75 mg daily.  3. Toprol-XL 12.5 mg b.i.d.  4. Lisinopril 5 mg daily.  5. Lipitor 80 mg nightly.  6. Synthroid 50 mcg daily.  7. Prilosec 20 mg daily p.r.n.  8. Nasonex as previously prescribed.  9. Nitroglycerin 0.4 mg sublingual p.r.n. chest pain.   OUTSTANDING LABORATORY STUDIES:  None.  The patient scheduled for a  Myoview tomorrow.   DURATION OF DISCHARGE/ENCOUNTER:  Forty five minutes including physician  time.  Nicolasa Ducking, ANP      Verne Carrow, MD  Electronically Signed    CB/MEDQ  D:  02/10/2008  T:  02/10/2008  Job:  [id number]   cc:   Geoffry Paradise, MD

## 2010-09-10 NOTE — H&P (Signed)
NAME:  Adrian Jensen, Adrian Jensen                ACCOUNT NO.:  [id number]   MEDICAL RECORD NO.:  [id number]          PATIENT TYPE:  OBV   LOCATION:  3739                         FACILITY:  [facility]   PHYSICIAN:  Doylene Canning. Ladona Ridgel, MD    DATE OF BIRTH:  [birth year]   DATE OF ADMISSION:  12/28/2008  DATE OF DISCHARGE:                              HISTORY & PHYSICAL   ADMITTING DIAGNOSIS:  Rule out myocardial infarction.   SECONDARY DIAGNOSIS:  Known coronary artery disease status post prior  myocardial infarction.   CHIEF COMPLAINT:  Unexplained nausea, vomiting, and diaphoresis, just  like with my prior heart attack.   HISTORY OF PRESENT ILLNESS:  The patient is a 77 year old man with a  history of known coronary artery disease who is status post myocardial  infarction in the past.  He follows Dr. Riley Kill.  He has preserved left  ventricular systolic function and a recent Myoview scan (2009) which  demonstrates a small fixed inferior defect.  The patient was in his  usual state of health until today when he noted unexplained onset of  nausea, vomiting, and diaphoresis.  There was no substernal chest pain  or chest pressure, neck or jaw pain, and no back or shoulder pain.  He  subsequently presents to the hospital for additional evaluation.  His  initial electrocardiogram demonstrated sinus rhythm with PVCs, but no  acute ST-T wave changes.  He is admitted for additional evaluation.  His  nausea has resolved.  He denies shortness of breath.  While he felt very  poorly earlier in the day, he now feels well.  His additional past  medical history is notable for dyslipidemia for which he is on Lipitor.  He has a history of hypertension for which he is on beta-blockers and  ACE inhibitors.  He has a history of coronary artery disease status post  MI for which he takes Plavix and aspirin along with the above  medications.  He has a history of gastroesophageal reflux disease which  has been quiet.  He has a  history of hypothyroidism.   FAMILY HISTORY:  Notable for both parents dying in their 32s, neither  one had heart disease.  Otherwise, there is no significant family  history.   SOCIAL HISTORY:  The patient is married and lives in [place].  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  He denies alcohol,  tobacco, or recreational drug use.   REVIEW OF SYSTEMS:  Notable for history of orthostasis and diaphoresis  and chest pain in the past.  He has very minimal arthralgias.  Otherwise, all systems were reviewed and negative except as noted in the  HPI.   PHYSICAL EXAMINATION:  GENERAL:  He is a pleasant, well-appearing 12-  year-old man in no acute distress.  VITAL SIGNS:  Blood pressure was 117/80, pulse was 72 and regular,  respirations were 18, and temperature is 98.8.  HEENT:  Normocephalic and atraumatic.  Pupils were equal and round.  Oropharynx moist.  Sclerae anicteric.  NECK:  No jugular distention.  No thyromegaly.  Trachea is  midline.  Carotids are 2+ and symmetric.  LUNGS:  Clear bilaterally to auscultation.  No wheezes, rales, or  rhonchi are present.  There is no increased work of breathing.  CARDIOVASCULAR:  Regular rate and rhythm.  Normal S1 and S2.  I do not  appreciate an S3 or an S4 gallop.  The PMI was not enlarged nor  laterally displaced.  I did not appreciate any murmurs.  ABDOMEN:  Soft, nontender, and nondistended.  There is no organomegaly.  Bowel sounds present.  There is no rebound or guarding.  EXTREMITIES:  No cyanosis, clubbing, or edema.  The pulses are 2+ and  symmetric.  NEUROLOGIC:  Alert and oriented x3.  Cranial nerves intact.  Strength is  5/5 and symmetric.   The EKG demonstrates sinus rhythm with normal axis and intervals.  There  are occasional PVCs.  There are no acute ST-T wave changes.  Labs are  currently pending.   IMPRESSION:  1. Known coronary artery disease with prior myocardial infarction with      his current  set of symptoms exactly like that of his prior      myocardial infarction except for the absence of chest pressure.  It      is unclear whether this represents his anginal equivalent or it is      due to some visceral pain resulting in the same set of symptoms.  2. Hypertension.  Continue with current medications.  3. Dyslipidemia.  We will check fasting lipids and continue statin      therapy.  We will also follow his cardiac enzymes up and if      negative, consider early discharge.      Doylene Canning. Ladona Ridgel, MD  Electronically Signed     GWT/MEDQ  D:  12/28/2008  T:  12/29/2008  Job:  [id number]   cc:   Geoffry Paradise, M.D.

## 2010-09-10 NOTE — Assessment & Plan Note (Signed)
[facility]                            CARDIOLOGY OFFICE NOTE   [name], [name] Adrian Jensen                       MRN:          [id number]  DATE:04/10/2008                            DOB:          05/09/1932    Adrian Jensen is in for followup.  Cardiac-wise, he is really doing quite  well.  He denies any chest pain or shortness of breath.  Since I last  saw him, he was readmitted to the hospital with some atypical symptoms,  had negative enzymes.  He then was dismissed, brought over to the office  for an exercise radionuclide imaging study.  He exercised for 8 minutes  and 1 second, stopped due to fatigue and denied any chest pain without  any EKG changes.  There were frequent PVCs.  Myoview revealed a small  fixed defect in the inferior wall with no evidence of ischemia and  ejection fraction of 50%.  This would be expected.   His medications include Synthroid 50 mcg daily, enteric-coated aspirin  325 mg daily, Plavix 75 mg daily, metoprolol succinate 25 mg one-half  tablet b.i.d., lisinopril 5 daily, Lipitor 40.   On physical, he is alert and oriented in no distress.  Blood pressure is  106/74 and when retaken by me was 120/80, the pulse was 75.  No jugular  venous distention.  The lung fields were clear to auscultation and  percussion.  Cardiac rhythm was regular.   IMPRESSION:  1. Coronary artery disease status post non-ST elevation myocardial      infarction in January 2009 with stenting of the right coronary      artery and drug-eluting stents placed in the left anterior      descending.  2. Hypertension.  3. Hyperlipidemia.  4. Gastroesophageal reflux.  5. Hypothyroidism.  6. History of bladder cancer.   PLAN:  1. Continue current medical regimen.  2. Return to clinic in 6 months.  3. We will try to get the results of his laboratory studies to make      sure that his renal function is stable.     Adrian Jensen. Riley Kill, MD, [facility]  Electronically  Signed    TDS/MedQ  DD: 04/10/2008  DT: 04/11/2008  Job #: [id number]   cc:   Adrian Jensen, M.D.

## 2010-09-13 NOTE — Discharge Summary (Signed)
NAME:  Adrian Jensen, Adrian Jensen                          ACCOUNT NO.:  [id number]   MEDICAL RECORD NO.:  [id number]                   PATIENT TYPE:  INP   LOCATION:  5409                                 FACILITY:  [facility]   PHYSICIAN:  Sharolyn Douglas, M.D.                     DATE OF BIRTH:  [birth year]   DATE OF ADMISSION:  04/26/2003  DATE OF DISCHARGE:  04/29/2003                                 DISCHARGE SUMMARY   ADMISSION DIAGNOSES:  1. Left ankle bimalleolar fracture.  2. Hypertension.  3. Asthma.  4. Hypothyroidism.   DISCHARGE DIAGNOSES:  1. Status post open reduction internal fixation left ankle.  2. Left ankle bimalleolar fracture.  3. Hypertension.  4. Asthma.  5. Hypothyroidism.   CONSULTS:  None.   PROCEDURE:  On April 27, 2003, the patient was taken to the operating  room for a left ankle ORIF. This was done by Sharolyn Douglas, assistant Verlin Fester, P.A.-C., anesthesia was general.   LABORATORY DATA:  On April 26, 2003, CBC was within normal limits. PT,  INR, and PTT within normal limits. Basic metabolic panel within normal  limits with the exception of glucose of 123. UA was negative. X-rays from  April 26, 2003 of the chest showed no active disease. The left ankle  showed displaced trimalleolar ankle fracture. X-rays from April 27, 2003:  C-arm was used intra-operatively for reduction. EKG on December 29 with  normal sinus rhythm, read by [place].   BRIEF HISTORY:  The patient is a 77 year old who was climbing up a ladder in  her shed with Christmas decorations and unfortunately lost his balance and  fell off the ladder, coming down and landing all of his weight awkwardly on  his left ankle. He immediate onset of left ankle pain and unable to bear  weight. He was brought to [facility] emergency room for evaluation and was  found to have a left ankle fracture. Dr. Noel Gerold was consulted for orthopedic  evaluation and management. Risks and benefits of the ORIF  procedure were  discussed with the patient by Dr. Noel Gerold. He indicated understanding and  opted to proceed.   HOSPITAL COURSE:  The patient was admitted to [facility] on  April 26, 2003 overnight with a left ankle fracture. Following evaluation  by Dr. Noel Gerold, it was determined that he needed ORIF of the left ankle. Risks  and benefits of this were discussed with the patient and his family, and he  agreed to the procedure. He was taken to the operating room on April 27, 2003 for the above listed procedure. He tolerated the procedure well without  any intraoperative complications. He was transferred to the recovery room in  stable condition. Postoperatively, routine orthopedic protocol was followed.  He progressed well. He initially was a bit slow to progress with physical  therapy secondary to decrease function of his  right upper extremity  secondary to polio as a child; however, he did eventually progress well and  get to the point that he was independent with therapy on April 29, 2003 and  safe ambulating. He was on strict non weight bearing to the left lower  extremity. The patient did not develop any medical complications  postoperatively. He met all orthopedic goals without difficulty. He  completed a full course of antibiotics. By April 29, 2003, the patient had  met all orthopedic goals and was medically stable and ready for discharge.  He was safe with physical therapy ambulating, was non weight bearing on  patient's left lower extremity.   DISCHARGE PLAN:  The patient is non weight bearing, left lower extremity. He  should keep his splint clean, dry, and intact. He should elevate the left  lower extremity on pillows as much as possible, frequent toe motion and  right ankle motion. He should follow up with Dr. Noel Gerold in two weeks from  surgery. Call [phone] for an appointment.   MEDICATIONS AT DISCHARGE:  1. Percocet.  2. Robaxin.  3. Over-the-counter  laxatives as needed.  4. Aspirin 325 mg q.d.  5. Resume his regular home medicine.   DIET:  His diet is regular home diet as tolerated.   CONDITION ON DISCHARGE:  Stable and improved.   DISPOSITION:  The patient will be discharged to his home with his family's  assistance.      Verlin Fester, P.A.                       Sharolyn Douglas, M.D.    CM/MEDQ  D:  06/07/2003  T:  06/07/2003  Job:  [id number]

## 2010-09-13 NOTE — H&P (Signed)
NAME:  Adrian Jensen, Adrian Jensen                          ACCOUNT NO.:  [id number]   MEDICAL RECORD NO.:  [id number]                   PATIENT TYPE:  INP   LOCATION:  1610                                 FACILITY:  [facility]   PHYSICIAN:  Sharolyn Douglas, M.D.                     DATE OF BIRTH:  04/17/1933   DATE OF ADMISSION:  04/26/2003  DATE OF DISCHARGE:                                HISTORY & PHYSICAL   CHIEF COMPLAINT:  Left ankle pain.   HISTORY OF PRESENT ILLNESS:  The patient is a 77 year old male who was  climbing up on a ladder out in his shed with Christmas decoration,  unfortunately lost his balance, fell off of the ladder, came down landing  all of his weight awkwardly on his left ankle. He had immediate onset of  left ankle pain and was brought to [facility] for evaluation and  was found to have a left ankle fracture. Dr. Noel Gerold was consulted for  orthopedic evaluation and management. The risks and benefits of ORIF  procedure were discussed with the patient by Dr. Noel Gerold as well as myself. He  indicated understanding and he opted to proceed.   ALLERGIES:  No known drug allergies.   MEDICATIONS:  1. Synthroid 50 mcg daily.  2. Zyrtec 10 daily.  3. Nexium 40 daily.  4. Bisoprolol.  5. Hydrochlorothiazide 25/6.75 p.o. daily.   PAST MEDICAL HISTORY:  Significant for hypertension, asthma, GERD,  hypothyroidism, history of polio in his right shoulder.   PAST SURGICAL HISTORY:  Surgery for bladder cancer.   FAMILY HISTORY:  Noncontributory.   SOCIAL HISTORY:  The patient denies tobacco use, denies alcohol use. He is  married. Has grown children. Wife will be available to help him  postoperatively.   REVIEW OF SYSTEMS:  The patient denies fevers, chills, sweats, bleeding  tendency. CNS:  Denies blurry vision, double vision, seizures, headache,  paralysis. CARDIOVASCULAR:  Denies chest pain, angina, orthopnea,  claudication, or palpitations. PULMONARY:  Denies shortness of  breath,  productive cough, hemoptysis. GI:  Denies nausea and vomiting, diarrhea,  melena, or bloody stool. GU:  Denies dysuria, hematuria, discharge.  MUSCULOSKELETAL:  As per HPI.   PHYSICAL EXAMINATION:  GENERAL:  The patient is a 77 year old white male who  is alert and oriented in no acute distress. He is well-nourished. Appears  the stated age. Pleasant and cooperative to exam. The left lower extremity  is in a short leg splint at this point and elevated.  VITAL SIGNS:  Temperature 98.6, heart rate 69 and regular, respirations 20  and unlabored. Blood pressure 150/73.  HEENT:  Head is normocephalic, atraumatic. Pupils equal round and reactive.  Extraocular movements intact. Nares patent. Pharynx clear.  NECK:  Supple. No lymphadenopathy, thyromegaly, or bruits appreciated.  CHEST:  Clear to auscultation bilaterally. No rales, rhonchi, dry wheezes,  or friction rubs.  BREAST:  Not pertinent, not performed.  HEART:  S1, S2. Regular rate and rhythm. No murmurs, gallops, or rubs noted.  ABDOMEN:  Soft to palpation. Nontender, nondistended, no organomegaly noted.  GU:  Not pertinent, not performed.  EXTREMITIES:  The patient has decreased function of his right upper  extremity secondary to his polio as a child. His left lower extremity is in  a splint. Motor function to the toes is intact. Capillary refill is rapid.  Sensation is intact to the toes.   LABORATORY DATA:  X-ray shows the left ankle with a bimalleolar fracture,  displaced.   ASSESSMENT:  1. Left ankle fracture.  2. Hypothyroidism.  3. Hypertension.  4. Asthma.  5. Gastroesophageal reflux disease.  6. History of polio.   PLAN:  Admit to [facility] to Dr. Sharolyn Douglas. He was taken for an  ORIF of his left ankle on December 30, n.p.o. in preparation for surgery.      Verlin Fester, P.A.                       Sharolyn Douglas, M.D.    CM/MEDQ  D:  04/27/2003  T:  04/27/2003  Job:  [id number]

## 2010-09-13 NOTE — Op Note (Signed)
NAME:  Adrian Jensen, Adrian Jensen                          ACCOUNT NO.:  [id number]   MEDICAL RECORD NO.:  [id number]                   PATIENT TYPE:  INP   LOCATION:  8413                                 FACILITY:  [facility]   PHYSICIAN:  Sharolyn Douglas, M.D.                     DATE OF BIRTH:  1932-06-29   DATE OF PROCEDURE:  04/27/2003  DATE OF DISCHARGE:                                 OPERATIVE REPORT   PREOPERATIVE DIAGNOSIS:  Left bimalleolar ankle fracture dislocation.   POSTOPERATIVE DIAGNOSIS:  Left bimalleolar ankle fracture dislocation.   OPERATION/PROCEDURE:  Open reduction and internal fixation left ankle  fracture with lateral plate and medial screws.   SURGEON:  Sharolyn Douglas, M.D.   ASSISTANTJill Side Mahar, P.A.-C.   ANESTHESIA:  General endotracheal anesthesia.   COMPLICATIONS:  None.   INDICATIONS:  The patient is a 77 year old male who was admitted through the  [facility] Emergency Department yesterday after suffering a left ankle  fracture dislocation.  The ankle was reduced in the emergency room, placed  into a splint.  He has a history of polio and has poor function of the right  upper extremity.  He was unable to be discharged home with crutches because  of his right upper extremity deficit and his severe pain.  Risks, benefits  and alternatives of ORIF were extensively discussed with the patient.  He  elected to proceed.   DESCRIPTION OF PROCEDURE:  The patient was properly identified in the  holding area, taken to the operating room.  He underwent general  endotracheal anesthesia without difficulty.  He was carefully positioned on  the operating room table.  All bony prominences were padded.  The left lower  extremity was prepped and draped in the usual sterile fashion.  A tourniquet  was placed on the proximal thigh.  The tourniquet was inflated to 350 mmHg.  The ankle was evaluated and the swelling was felt to be appropriate.  The  skin was intact to proceed with  surgery.   An 8 cm incision was made laterally over the fibula.  The fracture site was  exposed.  There was severe comminution noted.  We were unable to  anatomically align the fibula because of the comminution.  We then proceeded  to expose the medial fracture.  Using a small vertical incision, the  fracture site was exposed.  A small piece of bone with cartilage was found  to be free within the fracture site and this was removed.  The ankle joint  was irrigated.  We then reduced the medial malleolus fracture and fixed it  in place with two 40 mm 4.0 cancellous screws.  We confirmed good reduction  of the ankle mortise on AP and lateral fluoroscopy.  We then placed a bridge  plate over the lateral fibula fracture.  We achieved six cortices above the  fracture and  three screws were placed distal to the fracture.  We had good  screw purchase.  We again evaluated the ankle mortise, found it to be near  anatomically reduced in both the AP and lateral planes.  Wounds were  irrigated.  We closed the wounds with 2-0 Vicryl followed by 3-0  interrupted nylon sutures on the skin.  Sterile dressing applied.  The  patient was placed into a well-padded splint, extubated without difficulty,  transferred to the recovery room in stable condition, able to move his upper  and lower extremities.                                               Sharolyn Douglas, M.D.    MC/MEDQ  D:  04/27/2003  T:  04/27/2003  Job:  [id number]

## 2010-09-13 NOTE — Op Note (Signed)
[facility]  Patient:    Adrian Jensen, Adrian Jensen Visit Number: [id number] MRN: [id number]          Service Type: END Location: ENDO Attending Physician:  Judeth Cornfield Dictated by:   Barbette Hair. Arlyce Dice, M.D. [facility] Proc. Date: 03/29/01 Admit Date:  03/29/2001   CC:         Adrian Jensen, M.D.   Operative Report  PROCEDURE:  Liver biopsy.  SURGEON:  Barbette Hair. Arlyce Dice, M.D.  INDICATIONS:  Mr. Boardley has had persistently abnormal liver tests. Ultrasound shows fatty infiltration of the liver only.  Serologies and various blood work has not been diagnostic.  Test is performed for further evaluation.  INFORMED CONSENT:  The patient provided consent after risks, benefits and alternatives were explained.  MEDICATIONS:  Versed 2 mg, fentanyl 25 mcg IV.  DESCRIPTION OF PROCEDURE:  The patient was placed in the supine position.  The liver was percussed. The skin was prepped with Betadine and injected with 1% lidocaine.  Percutaneous biopsy was obtained with two passes at the ninth right intercostal space of very fatty tissue.  It was not clear that liver parenchyma was obtained.  Accordingly, the patient will be referred to ultrasound for an ultrasound guided biopsy to ensure that adequate liver specimen is obtained.  The patient tolerated the procedure well. Dictated by:   Barbette Hair Arlyce Dice, M.D. [facility] Attending Physician:  Judeth Cornfield DD:  03/29/01 TD:  03/29/01 Job: [id number]

## 2010-09-13 NOTE — Op Note (Signed)
NAME:  Adrian Jensen, Adrian Jensen                          ACCOUNT NO.:  [id number]   MEDICAL RECORD NO.:  [id number]                   PATIENT TYPE:  AMB   LOCATION:  DAY                                  FACILITY:  [facility]   PHYSICIAN:  Ronald L. Ovidio Hanger, M.D.           DATE OF BIRTH:  09/03/1932   DATE OF PROCEDURE:  11/09/2003  DATE OF DISCHARGE:                                 OPERATIVE REPORT   DIAGNOSIS:  Gross hematuria.   OPERATION:  Cystourethroscopy, bilateral right ureteral pyelogram, bilateral  renal pelvic bladder barbotage cytologies and bladder biopsy.   SURGEON:  Lucrezia Starch. Earlene Plater, M.D.   ANESTHESIA:  LMA.   ESTIMATED BLOOD LOSS:  Negligible.   TUBES:  None.   COMPLICATIONS:  None.   PROCEDURE:  [name] is a very nice 77 year old white male who presented  with gross hematuria.  It has been somewhat persistent, and he has undergone  a CT scan of the abdomen and pelvis with and without contrast that revealed  no significant cause.  Office cystoscopy had been essentially normal with  BPH and an NNP-22 was negative.   After understanding the risks, benefits and alternatives, he elected to  proceed with the above procedure to fully discern the hematuria.   PROCEDURE IN DETAIL:  The patient was placed in the supine position after  proper LMA anesthesia and was placed in the dorsal lithotomy position,  prepped and draped with Betadine in a sterile fashion.  A cystourethroscopy  was performed with a 22.5 French Olympus pan endoscope utilizing the 12 and  70 degree lenses.  The bladder was carefully inspected.  It was noted to  have moderate trilobar hypertrophy with some median mar.  Efflux of clear  urine was noted from the normally placed ureteral orifices bilaterally.  There was grade 1 trabeculation of the bladder, and some diffuse  inflammation was noted but no focal lesions.  A 6 French open-ended catheter  was placed in the right renal pelvis.  Utilizing saline,  barbotage and right  renal pelvic cytologies were submitted to pathology.  Right retrograde  pyelogram was then injected with high opaque.  No filling defects were  noted, and the calyceal system along with the ureteral system filled out in  its entirety.  A similar procedure was performed on the left side, and the  right retrograde ureteral pyelogram on that side was also essentially normal  and again, left renal pelvic cytologies were sent.  Barbotage bladder  cytologies were then performed with normal saline after the stent had  opened, and the catheter had been removed.  Submitted again for cytology.  Biopsies obtained of the bladder in the posterior midline with cold cut  biopsy and cauterized with Bovie coagulation cautery.  No other lesions were  noted.  The bladder was drained.  The pan endoscope was removed.  The  patient was taken to the recovery room stable.  Ronald L. Ovidio Hanger, M.D.    RLD/MEDQ  D:  11/09/2003  T:  11/09/2003  Job:  [id number]

## 2010-09-22 ENCOUNTER — Emergency Department (HOSPITAL_COMMUNITY)
Admission: EM | Admit: 2010-09-22 | Discharge: 2010-09-23 | Disposition: A | Discharge status: Home / Self Care | Payer: Medicare Other | Attending: Emergency Medicine | Admitting: Emergency Medicine

## 2010-09-22 ENCOUNTER — Emergency Department (HOSPITAL_COMMUNITY): Payer: Medicare Other

## 2010-09-22 DIAGNOSIS — N509 Disorder of male genital organs, unspecified: Secondary | ICD-10-CM | POA: Insufficient documentation

## 2010-09-22 DIAGNOSIS — I252 Old myocardial infarction: Secondary | ICD-10-CM | POA: Insufficient documentation

## 2010-09-22 DIAGNOSIS — I10 Essential (primary) hypertension: Secondary | ICD-10-CM | POA: Insufficient documentation

## 2010-09-22 DIAGNOSIS — R1011 Right upper quadrant pain: Secondary | ICD-10-CM | POA: Insufficient documentation

## 2010-09-22 DIAGNOSIS — Z8612 Personal history of poliomyelitis: Secondary | ICD-10-CM | POA: Insufficient documentation

## 2010-09-22 DIAGNOSIS — E039 Hypothyroidism, unspecified: Secondary | ICD-10-CM | POA: Insufficient documentation

## 2010-09-22 DIAGNOSIS — R109 Unspecified abdominal pain: Secondary | ICD-10-CM | POA: Insufficient documentation

## 2010-09-22 DIAGNOSIS — R110 Nausea: Secondary | ICD-10-CM | POA: Insufficient documentation

## 2010-09-22 DIAGNOSIS — Z79899 Other long term (current) drug therapy: Secondary | ICD-10-CM | POA: Insufficient documentation

## 2010-09-22 DIAGNOSIS — R197 Diarrhea, unspecified: Secondary | ICD-10-CM | POA: Insufficient documentation

## 2010-09-22 LAB — CBC
HCT: 40 % (ref 39.0–52.0)
Hemoglobin: 13.8 g/dL (ref 13.0–17.0)
MCH: 29.2 pg (ref 26.0–34.0)
MCV: 84.6 fL (ref 78.0–100.0)
Platelets: 178 10*3/uL (ref 150–400)
RBC: 4.73 MIL/uL (ref 4.22–5.81)
WBC: 10.6 10*3/uL — ABNORMAL HIGH (ref 4.0–10.5)

## 2010-09-22 LAB — DIFFERENTIAL
Lymphocytes Relative: 14 % (ref 12–46)
Lymphs Abs: 1.5 10*3/uL (ref 0.7–4.0)
Monocytes Relative: 8 % (ref 3–12)
Neutro Abs: 8.2 10*3/uL — ABNORMAL HIGH (ref 1.7–7.7)
Neutrophils Relative %: 77 % (ref 43–77)

## 2010-09-22 LAB — CK TOTAL AND CKMB (NOT AT ARMC)
CK, MB: 3 ng/mL (ref 0.3–4.0)
Relative Index: 2.4 (ref 0.0–2.5)

## 2010-09-22 LAB — URINALYSIS, ROUTINE W REFLEX MICROSCOPIC
Glucose, UA: NEGATIVE mg/dL
Ketones, ur: 15 mg/dL — AB
Protein, ur: NEGATIVE mg/dL
Urobilinogen, UA: 1 mg/dL (ref 0.0–1.0)

## 2010-09-22 LAB — COMPREHENSIVE METABOLIC PANEL
Albumin: 3.5 g/dL (ref 3.5–5.2)
Alkaline Phosphatase: 91 U/L (ref 39–117)
BUN: 19 mg/dL (ref 6–23)
CO2: 28 mEq/L (ref 19–32)
Chloride: 104 mEq/L (ref 96–112)
Creatinine, Ser: 0.91 mg/dL (ref 0.4–1.5)
GFR calc non Af Amer: >60 mL/min (ref >60)
Potassium: 3.9 mEq/L (ref 3.5–5.1)
Total Bilirubin: 0.6 mg/dL (ref 0.3–1.2)

## 2010-09-22 LAB — URINE MICROSCOPIC-ADD ON

## 2010-09-24 LAB — URINE CULTURE: Colony Count: 25000

## 2010-10-02 ENCOUNTER — Other Ambulatory Visit: Payer: Self-pay | Admitting: Cardiology

## 2010-12-02 ENCOUNTER — Other Ambulatory Visit: Payer: Self-pay | Admitting: Cardiology

## 2011-01-16 LAB — CBC
HCT: 40.5
HCT: 40.7
Hemoglobin: 13.3
Hemoglobin: 14.3
MCHC: 34.1
MCHC: 34.4
MCHC: 34.8
MCV: 86.6
MCV: 86.9
MCV: 87.9
Platelets: 174
Platelets: 176
RBC: 4.42
RBC: 4.66
RBC: 4.77
RDW: 13.3
RDW: 13.3
RDW: 13.4
WBC: 12.1 — ABNORMAL HIGH
WBC: 9.8

## 2011-01-16 LAB — BASIC METABOLIC PANEL
CO2: 26
CO2: 26
CO2: 30
Calcium: 8.2 — ABNORMAL LOW
Calcium: 8.6
Calcium: 8.9
Chloride: 101
Creatinine, Ser: 0.98
Creatinine, Ser: 1.03
GFR calc Af Amer: >60
GFR calc Af Amer: >60
GFR calc Af Amer: >60
GFR calc non Af Amer: >60
GFR calc non Af Amer: >60
Glucose, Bld: 110 — ABNORMAL HIGH
Glucose, Bld: 94
Potassium: 3.8
Sodium: 135
Sodium: 135
Sodium: 138

## 2011-01-16 LAB — POCT I-STAT 3, ART BLOOD GAS (G3+)
Bicarbonate: 22
Operator id: 284701
TCO2: 23
pH, Arterial: 7.589 — ABNORMAL HIGH
pO2, Arterial: 139 — ABNORMAL HIGH

## 2011-01-16 LAB — CARDIAC PANEL(CRET KIN+CKTOT+MB+TROPI)
CK, MB: 53.2 — ABNORMAL HIGH
Total CK: 529 — ABNORMAL HIGH
Total CK: 764 — ABNORMAL HIGH
Troponin I: 11.62
Troponin I: 20.49

## 2011-01-16 LAB — CK TOTAL AND CKMB (NOT AT ARMC)
CK, MB: 2.4
Relative Index: INVALID
Total CK: 90

## 2011-01-16 LAB — HEPATIC FUNCTION PANEL
ALT: 24
Alkaline Phosphatase: 52
Bilirubin, Direct: 0.1
Indirect Bilirubin: 0.7
Total Bilirubin: 0.8

## 2011-01-16 LAB — COMPREHENSIVE METABOLIC PANEL
Albumin: 3.5
BUN: 15
Calcium: 9.3
Chloride: 101
Creatinine, Ser: 0.83
Total Bilirubin: 0.8

## 2011-01-16 LAB — I-STAT EC8
BUN: 19
Bicarbonate: 21.4
Chloride: 106
pCO2 arterial: 24.1 — ABNORMAL LOW
pH, Arterial: 7.557 — ABNORMAL HIGH

## 2011-01-16 LAB — POCT I-STAT CREATININE: Creatinine, Ser: 1

## 2011-01-16 LAB — LIPID PANEL
HDL: 42
Total CHOL/HDL Ratio: 4.8
Triglycerides: 60
VLDL: 12

## 2011-01-28 LAB — DIFFERENTIAL
Eosinophils Relative: 3
Lymphocytes Relative: 20
Lymphs Abs: 1.2
Monocytes Absolute: 0.6
Monocytes Relative: 10
Neutro Abs: 4.2

## 2011-01-28 LAB — PROTIME-INR
INR: 0.9
Prothrombin Time: 12.5

## 2011-01-28 LAB — POCT CARDIAC MARKERS: Troponin i, poc: <0.05

## 2011-01-28 LAB — BASIC METABOLIC PANEL
Calcium: 9.3
GFR calc Af Amer: >60
GFR calc non Af Amer: >60
Glucose, Bld: 76
Potassium: 4.3
Sodium: 139

## 2011-01-28 LAB — CARDIAC PANEL(CRET KIN+CKTOT+MB+TROPI)
CK, MB: 1.5
CK, MB: 1.8
Relative Index: 1.7
Total CK: 98
Troponin I: 0.02

## 2011-01-28 LAB — CK TOTAL AND CKMB (NOT AT ARMC)
CK, MB: 2.1
Relative Index: 1.7
Total CK: 126

## 2011-01-28 LAB — TSH: TSH: 1.945

## 2011-01-28 LAB — CBC
HCT: 43.7
Hemoglobin: 14.8
RBC: 4.9
RDW: 13.6
WBC: 6.3

## 2011-02-13 ENCOUNTER — Other Ambulatory Visit: Payer: Self-pay | Admitting: Cardiology

## 2011-02-28 ENCOUNTER — Encounter: Payer: Self-pay | Admitting: *Deleted

## 2011-03-04 ENCOUNTER — Ambulatory Visit (INDEPENDENT_AMBULATORY_CARE_PROVIDER_SITE_OTHER): Payer: Medicare Other | Admitting: Cardiology

## 2011-03-04 ENCOUNTER — Encounter: Payer: Self-pay | Admitting: Cardiology

## 2011-03-04 VITALS — BP 138/84 | HR 78 | Ht 70.0 in | Wt 188.1 lb

## 2011-03-04 DIAGNOSIS — E785 Hyperlipidemia, unspecified: Secondary | ICD-10-CM

## 2011-03-04 DIAGNOSIS — I2510 Atherosclerotic heart disease of native coronary artery without angina pectoris: Secondary | ICD-10-CM

## 2011-03-04 DIAGNOSIS — E039 Hypothyroidism, unspecified: Secondary | ICD-10-CM

## 2011-03-04 DIAGNOSIS — I10 Essential (primary) hypertension: Secondary | ICD-10-CM

## 2011-03-04 NOTE — Patient Instructions (Signed)
Your physician wants you to follow-up in: 1 year with Dr. Stuckey. You will receive a reminder letter in the mail two months in advance. If you don't receive a letter, please call our office to schedule the follow-up appointment.  

## 2011-03-09 NOTE — Progress Notes (Signed)
HPI:  Adrian Jensen is doing very well.  No current complaints.  He has no clinical bleeding.  He denies chest pain.  Feels well overall.  No new symptoms.   Continues to follow up with Dr. Jacky Kindle.  Current Outpatient Prescriptions  Medication Sig Dispense Refill  . aspirin 81 MG tablet Take 81 mg by mouth daily.        . Coenzyme Q10 (COQ10) 200 MG CAPS Take by mouth daily.        Marland Kitchen levothyroxine (SYNTHROID, LEVOTHROID) 50 MCG tablet Take 50 mcg by mouth daily.        Marland Kitchen LIPITOR 40 MG tablet TAKE 1 TABLET ONCE DAILY.  30 each  6  . mometasone (NASONEX) 50 MCG/ACT nasal spray Place 2 sprays into the nose daily.        . nitroGLYCERIN (NITROSTAT) 0.4 MG SL tablet Place 0.4 mg under the tongue every 5 (five) minutes as needed.        . Omega-3 Fatty Acids (FISH OIL) 1000 MG CAPS Take 1 capsule by mouth daily.        Marland Kitchen PLAVIX 75 MG tablet TAKE 1 TABLET EACH DAY.  30 each  12  . TOPROL XL 25 MG 24 hr tablet TAKE (1/2) TABLET TWICE  DAILY.  30 each  5    No Known Allergies  Past Medical History  Diagnosis Date  . Coronary artery disease   . Hypertension   . Hyperlipidemia   . GERD (gastroesophageal reflux disease)   . Hypothyroidism   . Personal history of malignant neoplasm of bladder   . Asthma     Past Surgical History  Procedure Date  . Coronary stent placement     of the right coronary artery and left anterior descending  . Bladder surgery   . Liver biopsy   . Ankle surgery     left  . Cystourethroscopy   . Tonsillectomy     Family History  Problem Relation Age of Onset  . Other      both of his parents were in their mid 75s when they died, neither one had heart disease  . Heart disease Brother     he developed heart disease after the age of 36    History   Social History  . Marital Status: Married    Spouse Name: N/A    Number of Children: N/A  . Years of Education: N/A   Occupational History  . retired    Social History Main Topics  . Smoking status: Never Smoker    . Smokeless tobacco: Not on file  . Alcohol Use: No  . Drug Use: No  . Sexually Active: Not on file   Other Topics Concern  . Not on file   Social History Narrative  . No narrative on file    ROS: Please see the HPI.  All other systems reviewed and negative.  PHYSICAL EXAM:  BP 138/84  Pulse 78  Ht 5\' 10"  (1.778 m)  Wt 85.331 kg (188 lb 1.9 oz)  BMI 26.99 kg/m2  General: Well developed, well nourished, in no acute distress. Head:  Normocephalic and atraumatic. Neck: no JVD Lungs: Clear to auscultation and percussion. Heart: Normal S1 and S2.  No murmur, rubs or gallops.  Abdomen:  Normal bowel sounds; soft; non tender; no organomegaly Pulses: Pulses normal in all 4 extremities. Extremities: No clubbing or cyanosis. No edema. Neurologic: Alert and oriented x 3.  EKG:  NSR.  Nonspecific T abnormality.  Occasional PVC.    ASSESSMENT AND PLAN:

## 2011-03-11 NOTE — Assessment & Plan Note (Signed)
Followed by Dr. Aronson. 

## 2011-03-11 NOTE — Assessment & Plan Note (Signed)
Last labs look pretty good.  They are being done at Dr. Lanell Matar office.  SGPT was slightly elevated.  Should be rechecked at some point in time.  Will forward to Dr. Lanell Matar office.

## 2011-03-11 NOTE — Assessment & Plan Note (Addendum)
No recurrent chest pain.  Seems to be doing well.  Remains on dual antiplatelet therapy given extent of stenting.  Will revisit annually.  No clinical bleeding.

## 2011-03-11 NOTE — Assessment & Plan Note (Signed)
Currently well controlled, and following with Dr. Jacky Kindle.   He does his lab work.

## 2011-05-15 ENCOUNTER — Other Ambulatory Visit: Payer: Self-pay | Admitting: Cardiology

## 2011-09-17 ENCOUNTER — Other Ambulatory Visit: Payer: Self-pay | Admitting: Cardiology

## 2011-12-30 ENCOUNTER — Other Ambulatory Visit: Payer: Self-pay | Admitting: Cardiology

## 2012-01-27 ENCOUNTER — Other Ambulatory Visit: Payer: Self-pay | Admitting: Cardiology

## 2012-03-03 ENCOUNTER — Encounter: Payer: Self-pay | Admitting: Cardiology

## 2012-03-03 ENCOUNTER — Ambulatory Visit (INDEPENDENT_AMBULATORY_CARE_PROVIDER_SITE_OTHER): Payer: Medicare Other | Admitting: Cardiology

## 2012-03-03 VITALS — BP 136/86 | HR 73 | Ht 70.0 in | Wt 193.0 lb

## 2012-03-03 DIAGNOSIS — I2510 Atherosclerotic heart disease of native coronary artery without angina pectoris: Secondary | ICD-10-CM

## 2012-03-03 DIAGNOSIS — I10 Essential (primary) hypertension: Secondary | ICD-10-CM

## 2012-03-03 DIAGNOSIS — E785 Hyperlipidemia, unspecified: Secondary | ICD-10-CM

## 2012-03-03 NOTE — Assessment & Plan Note (Signed)
Patient remains stable at the present time. He would be stable to come off of clopidogrel for procedure such as colonoscopy. However, my leaning is in the direction of continued medical therapy given one the diffuseness of his disease, and to the presence of 5 stents including drug 2 drug-eluting platforms in 2 different vascular territories.  I discussed with him the guidelines recommendations, I also discussed with him current ongoing clinical trials designed to answer some of the questions with regard to specific patient populations

## 2012-03-03 NOTE — Assessment & Plan Note (Signed)
Reasonable control. 

## 2012-03-03 NOTE — Assessment & Plan Note (Signed)
This is under the care of Dr. Jacky Kindle.

## 2012-03-03 NOTE — Progress Notes (Signed)
HPI:  Patient seen today in a followup visit. From a cardiac standpoint he is doing well he is no major complaints. We reviewed again in great detail today the use of dual antiplatelet therapy. The patient has 5 stents. 3 her bare-metal, and to her second generation stents. We talked about the possibility of coming off. We also talked about potential benefits of staying on given the diffuseness of his disease. He currently is having no symptoms.  Current Outpatient Prescriptions  Medication Sig Dispense Refill  . aspirin 81 MG tablet Take 81 mg by mouth daily.        . Coenzyme Q10 (COQ10) 200 MG CAPS Take by mouth daily.        Marland Kitchen levothyroxine (SYNTHROID, LEVOTHROID) 50 MCG tablet Take 50 mcg by mouth daily.        Marland Kitchen LIPITOR 40 MG tablet TAKE 1 TABLET ONCE DAILY.  30 each  6  . mometasone (NASONEX) 50 MCG/ACT nasal spray Place 2 sprays into the nose daily.        . nitroGLYCERIN (NITROSTAT) 0.4 MG SL tablet Place 0.4 mg under the tongue every 5 (five) minutes as needed.        Marland Kitchen PLAVIX 75 MG tablet TAKE 1 TABLET EACH DAY.  30 tablet  9  . TOPROL XL 25 MG 24 hr tablet TAKE (1/2) TABLET TWICE DAILY.  30 each  6    No Known Allergies  Past Medical History  Diagnosis Date  . Coronary artery disease   . Hypertension   . Hyperlipidemia   . GERD (gastroesophageal reflux disease)   . Hypothyroidism   . Personal history of malignant neoplasm of bladder   . Asthma     Past Surgical History  Procedure Date  . Coronary stent placement     of the right coronary artery and left anterior descending  . Bladder surgery   . Liver biopsy   . Ankle surgery     left  . Cystourethroscopy   . Tonsillectomy     Family History  Problem Relation Age of Onset  . Other      both of his parents were in their mid 7s when they died, neither one had heart disease  . Heart disease Brother     he developed heart disease after the age of 67    History   Social History  . Marital Status: Married      Spouse Name: N/A    Number of Children: N/A  . Years of Education: N/A   Occupational History  . retired    Social History Main Topics  . Smoking status: Never Smoker   . Smokeless tobacco: Not on file  . Alcohol Use: No  . Drug Use: No  . Sexually Active: Not on file   Other Topics Concern  . Not on file   Social History Narrative  . No narrative on file    ROS: Please see the HPI.  All other systems reviewed and negative.  PHYSICAL EXAM:  BP 136/86  Pulse 73  Ht 5\' 10"  (1.778 m)  Wt 193 lb (87.544 kg)  BMI 27.69 kg/m2  SpO2 96%  General: Well developed, well nourished, in no acute distress. Head:  Normocephalic and atraumatic. Neck: no JVD Lungs: Clear to auscultation and percussion. Heart: Normal S1 and S2.  No murmur, rubs or gallops.  Pulses: Pulses normal in all 4 extremities. Extremities: No clubbing or cyanosis. No edema. Neurologic: Alert and oriented x  3.  EKG:  NSR.  Occasional PVCS. No acute changes.    ASSESSMENT AND PLAN:

## 2012-03-04 NOTE — Patient Instructions (Signed)
Your physician wants you to follow-up in: 1 YEAR with Dr Cooper.  You will receive a reminder letter in the mail two months in advance. If you don't receive a letter, please call our office to schedule the follow-up appointment.  Your physician recommends that you continue on your current medications as directed. Please refer to the Current Medication list given to you today.  

## 2012-04-22 ENCOUNTER — Other Ambulatory Visit: Payer: Self-pay | Admitting: *Deleted

## 2012-04-22 MED ORDER — METOPROLOL SUCCINATE ER 25 MG PO TB24: ORAL_TABLET | ORAL | Status: DC

## 2012-05-17 IMAGING — CT CT ABD-PELV W/O CM
2 of 4 series · 13 of 32 positions shown, 18 images · non-contrast
Comparison: None.

CLINICAL DATA: Right-sided flank pain; cold sweats.

CT ABDOMEN AND PELVIS WITHOUT CONTRAST
TECHNIQUE: Multidetector CT imaging of the abdomen and pelvis was
performed following the standard protocol without intravenous
contrast.

[Series 2: renal stone · axial · 0.80mm/px · z∈[-447,-112]mm · 5 of 101 slices shown, 10 images]
[im 17/101  soft-tissue]
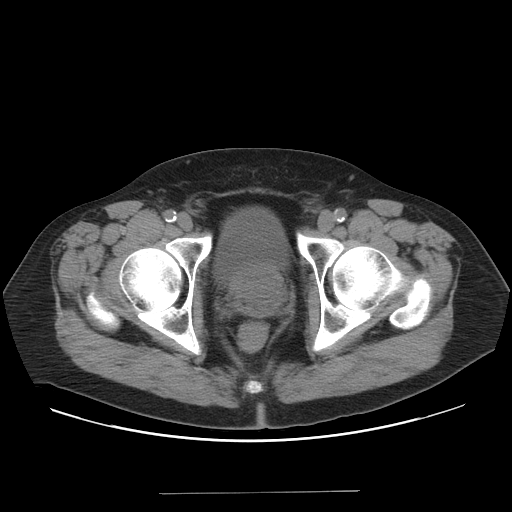
[im 17/101  bone]
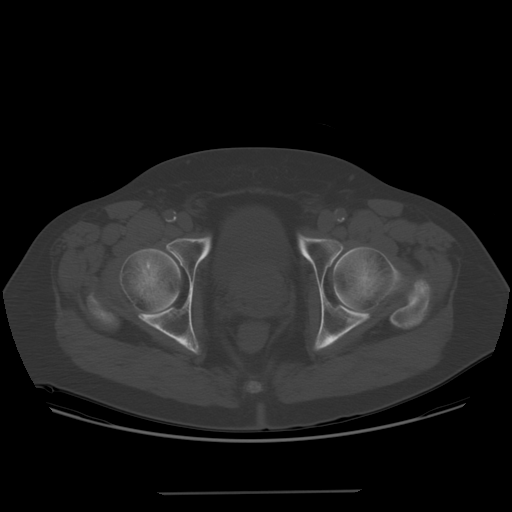
[im 34/101  soft-tissue]
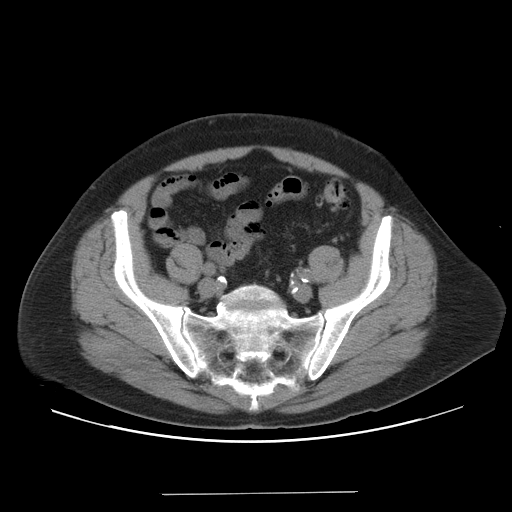
[im 34/101  lung]
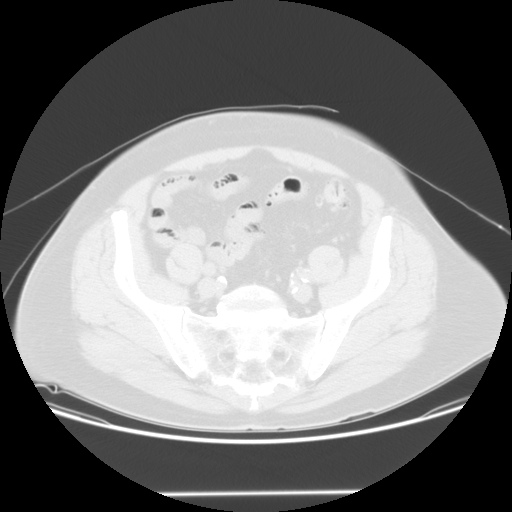
[im 51/101  soft-tissue]
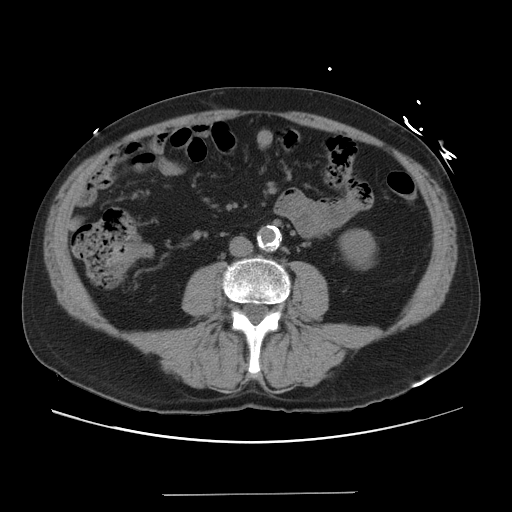
[im 51/101  lung]
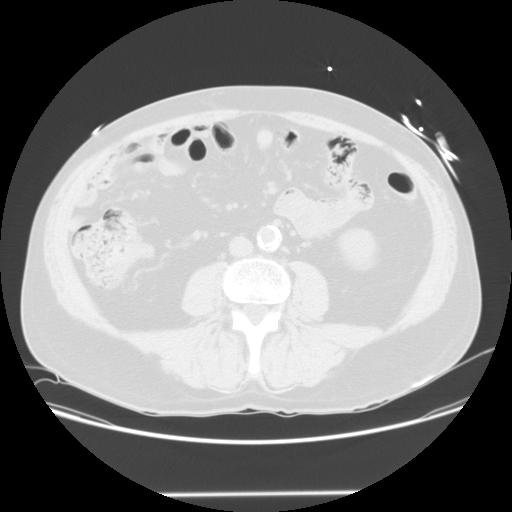
[im 67/101  soft-tissue]
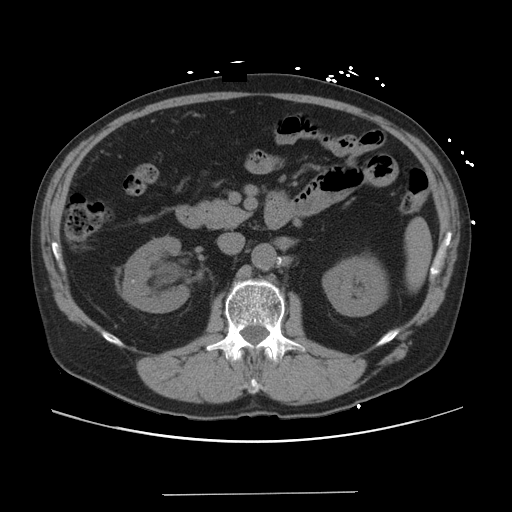
[im 67/101  lung]
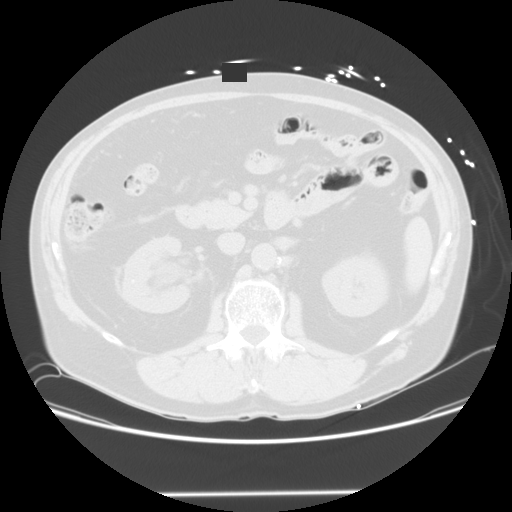
[im 84/101  soft-tissue]
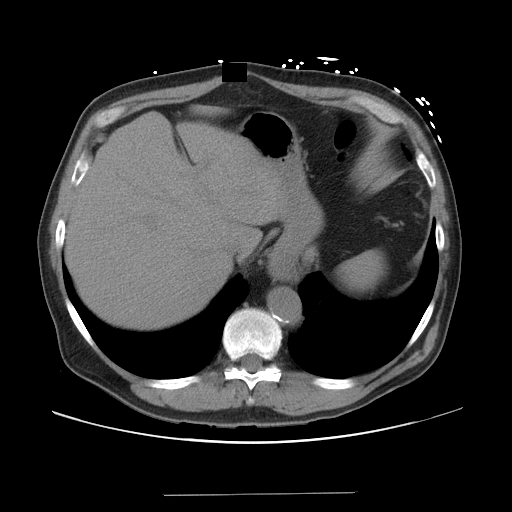
[im 84/101  lung]
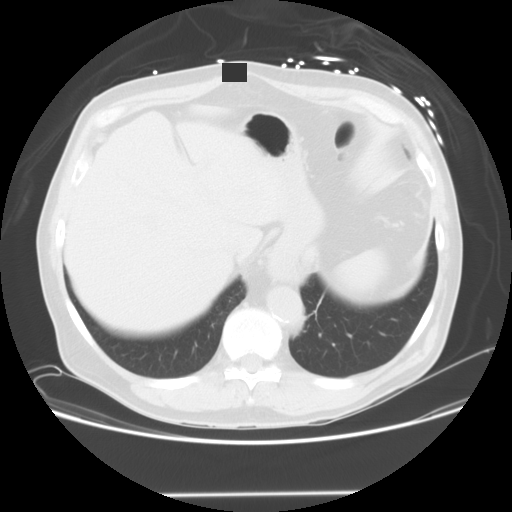

[Series 401: reformatted · sagittal · 1.00mm/px · 8 of 189 slices shown]
[im 16/189  soft-tissue]
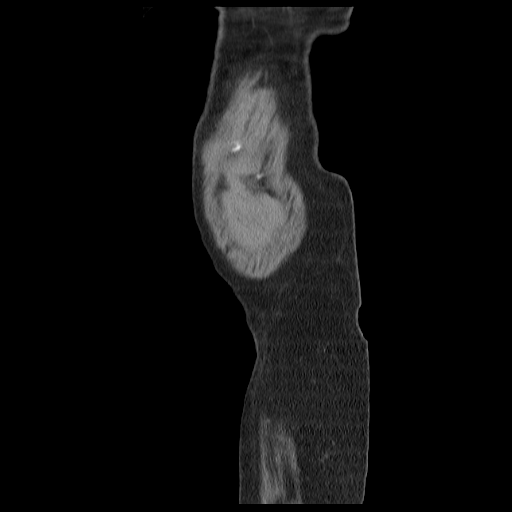
[im 48/189  soft-tissue]
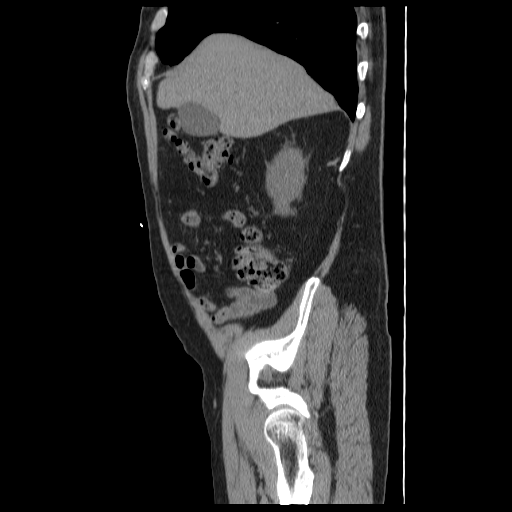
[im 63/189  soft-tissue]
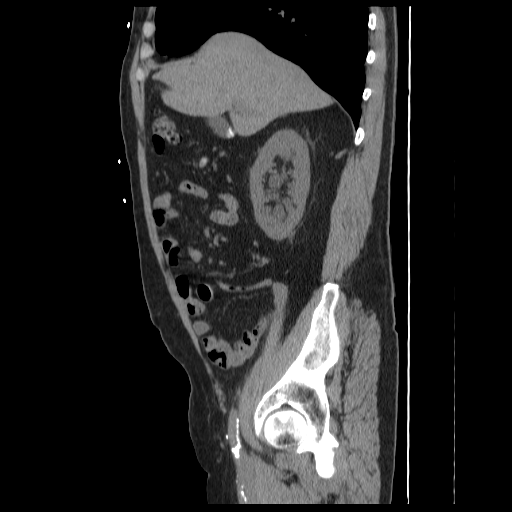
[im 79/189  soft-tissue]
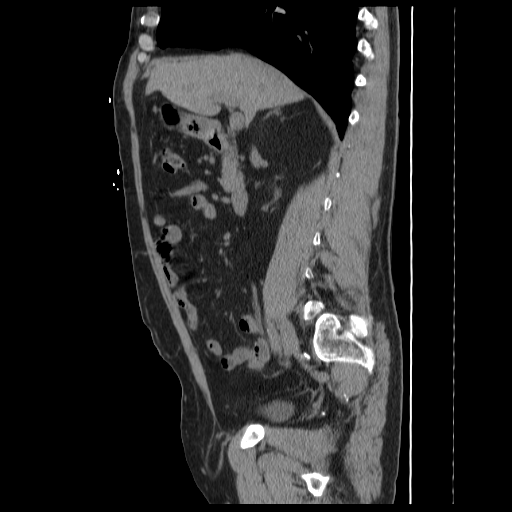
[im 110/189  soft-tissue]
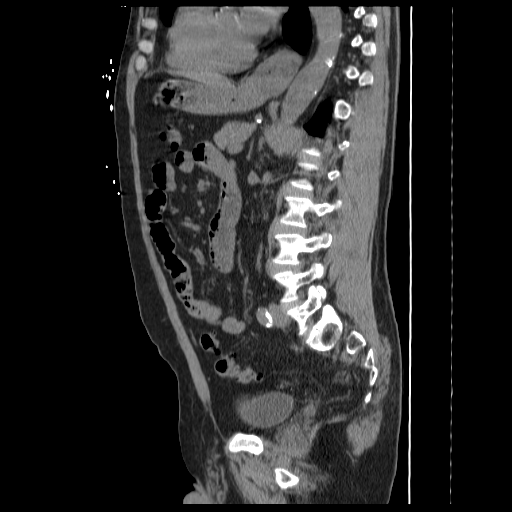
[im 126/189  soft-tissue]
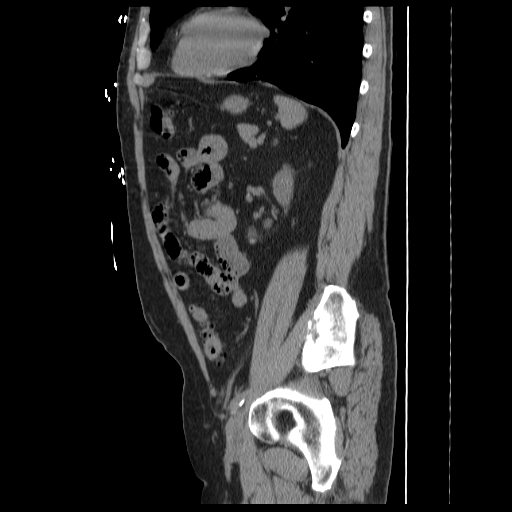
[im 142/189  soft-tissue]
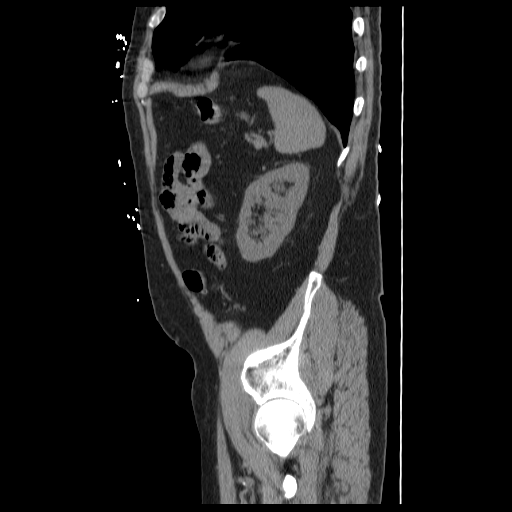
[im 173/189  soft-tissue]
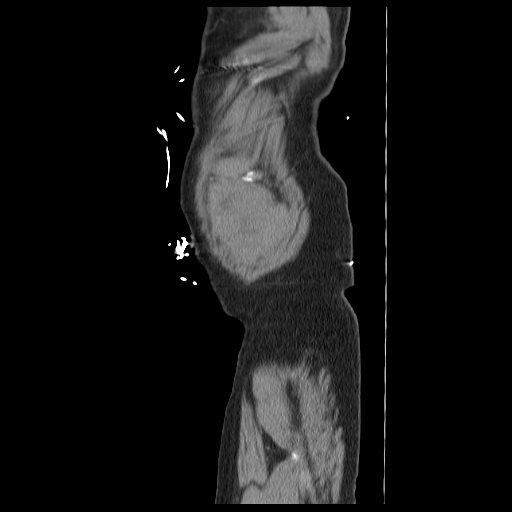

[13 of 32 positions shown; findings below may reference images not displayed]

FINDINGS: The visualized lung bases are clear.  Diffuse coronary
artery calcifications are noted.

The liver and spleen are unremarkable in appearance.  Stones are
seen dependently within the gallbladder; the gallbladder is
otherwise unremarkable in appearance.  The pancreas and adrenal
glands are unremarkable.

There is mild right-sided hydronephrosis, with right-sided
perinephric stranding and fluid, and prominence of the right ureter
to the level of an obstructing 5 x 4 mm stone in the distal right
ureter, 3 cm above the right renal pelvis.

No nonobstructing renal stones are identified.  Multiple left-sided
parapelvic cysts are incidentally seen.  Minimal nonspecific right-
sided perinephric stranding is noted.

No free fluid is identified.  The small bowel is unremarkable in
appearance. A small to moderate hiatal hernia is noted; the stomach
is otherwise unremarkable in appearance.  No acute vascular
abnormalities are seen. There is diffuse calcification along the
abdominal aorta and its branches, including at the origins of both
renal arteries, with associated intramural thrombus.

The appendix is normal in caliber, without evidence for
appendicitis.  Diverticulosis is noted along the entire length of
the colon, without evidence of diverticulitis.  This is most
prominent along the proximal sigmoid colon.  The colon is otherwise
unremarkable in appearance.

The bladder is mildly distended and grossly unremarkable in
appearance.  The prostate is mildly enlarged, measuring 4.9 cm in
transverse dimension, with scattered calcification.  A small right
inguinal hernia is noted, containing only fat.  No inguinal
lymphadenopathy is seen.

No acute osseous abnormalities are identified.
IMPRESSION: 1.  Mild right-sided hydronephrosis, with right-sided perinephric
stranding and fluid, and an obstructing 5 x 4 mm stone in the
distal right ureter, 3 cm above the right renal pelvis.

2.  Small to moderate hiatal hernia noted.
3.  Multiple left-sided parapelvic renal cysts incidentally seen.
4.  Diffuse coronary artery calcifications noted.
5.  Cholelithiasis.
6.  Diverticulosis along the entire length of the colon, without
evidence of diverticulitis.
7.  Small right inguinal hernia, containing only fat.
8.  Diffuse calcification along the abdominal aorta and its
branches, including at the origins of both renal arteries.

## 2012-08-06 ENCOUNTER — Encounter: Payer: Self-pay | Admitting: Physician Assistant

## 2012-08-16 ENCOUNTER — Encounter: Payer: Self-pay | Admitting: Physician Assistant

## 2012-08-16 ENCOUNTER — Encounter: Payer: Self-pay | Admitting: Gastroenterology

## 2012-08-16 ENCOUNTER — Ambulatory Visit (INDEPENDENT_AMBULATORY_CARE_PROVIDER_SITE_OTHER): Payer: Medicare Other | Admitting: Physician Assistant

## 2012-08-16 VITALS — BP 120/74 | HR 60 | Ht 70.0 in | Wt 190.8 lb

## 2012-08-16 DIAGNOSIS — Z1211 Encounter for screening for malignant neoplasm of colon: Secondary | ICD-10-CM

## 2012-08-16 MED ORDER — MOVIPREP 100 G PO SOLR: 1.0000 | Freq: Once | ORAL | Status: DC

## 2012-08-16 NOTE — Progress Notes (Signed)
Reviewed and agree with management. Maciah Schweigert D. Alanmichael Barmore, M.D., FACG  

## 2012-08-16 NOTE — Progress Notes (Signed)
Subjective:    Patient ID: Adrian Jensen, male    DOB: 02-12-1933, 79 y.o.   MRN: [id number]  HPI Adrian Jensen is a very nice 79 year old white male known to Dr. Arlyce Dice remotely from colonoscopy done for screening in 2002. He is referred back today for followup colonoscopy. He is asymptomatic with the exception of occasional mild constipation. He does have history of coronary artery disease and has 5 stents-3R bare-metal stents and 2 are drug-eluting stents with last procedure done in 2009 her Dr. Riley Kill. He has been maintained on Plavix and aspirin and has done well. He also has history of hypertension hypothyroidism and hyperlipidemia as well as asthma. Family history is negative for colon cancer/ polyps.    Review of Systems  Constitutional: Negative.   Eyes: Negative.   Respiratory: Negative.   Cardiovascular: Negative.   Gastrointestinal: Positive for constipation.  Endocrine: Negative.   Musculoskeletal: Positive for back pain.  Skin: Negative.   Allergic/Immunologic: Negative.   Neurological: Negative.   Hematological: Negative.   Psychiatric/Behavioral: Negative.    Outpatient Prescriptions Prior to Visit  Medication Sig Dispense Refill  . aspirin 81 MG tablet Take 81 mg by mouth daily.        Marland Kitchen levothyroxine (SYNTHROID, LEVOTHROID) 50 MCG tablet Take 50 mcg by mouth daily.        Marland Kitchen LIPITOR 40 MG tablet TAKE 1 TABLET ONCE DAILY.  30 each  6  . metoprolol succinate (TOPROL XL) 25 MG 24 hr tablet Take 1/2  Tablet by mouth twice daily.  30 tablet  6  . mometasone (NASONEX) 50 MCG/ACT nasal spray Place 2 sprays into the nose daily.        . nitroGLYCERIN (NITROSTAT) 0.4 MG SL tablet Place 0.4 mg under the tongue every 5 (five) minutes as needed.        Marland Kitchen PLAVIX 75 MG tablet TAKE 1 TABLET EACH DAY.  30 tablet  9  . Coenzyme Q10 (COQ10) 200 MG CAPS Take by mouth daily.         No facility-administered medications prior to visit.   No Known Allergies Patient Active Problem List   Diagnosis  . HYPOTHYROIDISM  . HYPERLIPIDEMIA  . HYPERTENSION  . CAD  . ASTHMA  . GASTROESOPHAGEAL REFLUX DISEASE  . NEOPLASM, MALIGNANT, BLADDER, HX OF   History  Substance Use Topics  . Smoking status: Never Smoker   . Smokeless tobacco: Never Used  . Alcohol Use: No   family history includes Breast cancer in his sister; Heart disease in his brother; Other in an unspecified family member; and Prostate cancer in his father.     Objective:   Physical Exam well-developed elderly white male in no acute distress, appears uncomfortable. Blood pressure 120/74 pulse 60 height 5 foot 10 weight 190. HEENT; nontraumatic, normocephalic EOMI PERRLA sclera anicteric, Neck; supple no JVD, Cardiovascular; regular rate and rhythm with S1-S2 no murmur or gallop, Pulmonary; clear bilaterally, Abdomen; soft nontender nondistended bowel sounds are active there is no palpable mass or hepatosplenomegaly, Rectal; exam not done, Extremities; no clubbing cyanosis or edema skin warm and dry, Psych; mood and affect normal and appropriate.        Assessment & Plan:  #52  79 year old male referred for screening colonoscopy, negative colonoscopy 2002. #2  mild constipation-he will continue Colace as needed and Metamucil #3  chronic antiplatelet therapy with aspirin and Plavix #4  Coronary artery disease status post multiple stents-last 2009 #5  hypertension #6 hyperlipidemia #7 asthma  Plan; Will schedule for colonoscopy with Dr. Leeroy Bock reviewed in detail and he is agreeable to proceed. We will obtain permission from his cardiologist for him to hold his Plavix for 5 days prior to the procedure-relative risk /benefit  of holding antiplatelet therapy also discussed.

## 2012-08-16 NOTE — Patient Instructions (Addendum)

## 2012-08-23 ENCOUNTER — Telehealth: Payer: Self-pay | Admitting: *Deleted

## 2012-08-23 NOTE — Telephone Encounter (Signed)
Message copied by Derry Skill on Mon Aug 23, 2012  3:27 PM ------      Message from: Tonny Bollman      Created: Sun Aug 22, 2012 10:17 PM      Regarding: RE: Plavix clearance for colonoscopy       Ok to hold plavix for colonoscopy should resume when ok with GI physician. I have not met this patient, but Dr Rosalyn Charters last note reviewed this issue.      ----- Message -----         From: Derry Skill, CMA         Sent: 08/16/2012  10:01 AM           To: Sharyn Blitz, RN, Tonny Bollman, MD      Subject: Plavix clearance for colonoscopy                               08/16/2012                        RE: BENJY KANA      DOB: [birth year]      MRN: [id number]                  Dear Tonny Bollman,                   We have scheduled the above patient for an endoscopic procedure. Our records show that he is on anticoagulation therapy. He was a patient of Dr. Rosalyn Charters prior to his retirement.            Please advise as to how long the patient may come off his therapy of Plavix prior to the procedure, which is scheduled for 09-13-2012.            Please fax back/ or route the completed form to [facility] CMA at [phone].             Sincerely,                        Amy Esterwood PA-C                        Lowry Ram CMA                                ------

## 2012-08-23 NOTE — Telephone Encounter (Signed)
I spoke to the patient's wife, he had stepped out.  I gave her the information on the Plavix.  I advised her that Dr. Excell Seltzer will now be his cardiologist.  Mr. Cullers is to stop the Plavix on 09-08-12 and will resume it again on 09-14-2012. She thanked me for letting them know.

## 2012-09-13 ENCOUNTER — Encounter: Payer: Self-pay | Admitting: Gastroenterology

## 2012-09-13 ENCOUNTER — Ambulatory Visit (AMBULATORY_SURGERY_CENTER): Payer: Medicare Other | Admitting: Gastroenterology

## 2012-09-13 ENCOUNTER — Encounter: Payer: Medicare Other | Admitting: Gastroenterology

## 2012-09-13 VITALS — BP 146/83 | HR 72 | Temp 96.9°F | Resp 19 | Ht 70.0 in | Wt 190.0 lb

## 2012-09-13 DIAGNOSIS — K5730 Diverticulosis of large intestine without perforation or abscess without bleeding: Secondary | ICD-10-CM

## 2012-09-13 DIAGNOSIS — D126 Benign neoplasm of colon, unspecified: Secondary | ICD-10-CM

## 2012-09-13 DIAGNOSIS — Z1211 Encounter for screening for malignant neoplasm of colon: Secondary | ICD-10-CM

## 2012-09-13 MED ORDER — SODIUM CHLORIDE 0.9 % IV SOLN: 500.0000 mL | INTRAVENOUS | Status: DC

## 2012-09-13 NOTE — Progress Notes (Signed)
Patient did not have preoperative order for IV antibiotic SSI prophylaxis. (G8918)  Patient did not experience any of the following events: a burn prior to discharge; a fall within the facility; wrong site/side/patient/procedure/implant event; or a hospital transfer or hospital admission upon discharge from the facility. (G8907)  

## 2012-09-13 NOTE — Progress Notes (Signed)
Stable to RR 

## 2012-09-13 NOTE — Op Note (Signed)
[facility] [address] [place], [address]   COLONOSCOPY PROCEDURE REPORT  PATIENT: Adrian, Jensen  MR#: [id number] BIRTHDATE: [birth year] , 79  yrs. old GENDER: Male ENDOSCOPIST: Louis Meckel, MD REFERRED [id number] Jacky Kindle, M.D. PROCEDURE DATE:  09/13/2012 PROCEDURE:   Colonoscopy with snare polypectomy and Colonoscopy with cold biopsy polypectomy ASA CLASS:   Class II INDICATIONS:average risk screening. MEDICATIONS: MAC sedation, administered by CRNA and Propofol (Diprivan) 140 mg IV  DESCRIPTION OF PROCEDURE:   After the risks benefits and alternatives of the procedure were thoroughly explained, informed consent was obtained.  A digital rectal exam revealed no abnormalities of the rectum.   The     endoscope was introduced through the anus and advanced to the cecum, which was identified by both the appendix and ileocecal valve. No adverse events experienced.   The quality of the prep was excellent using Suprep The instrument was then slowly withdrawn as the colon was fully examined.      COLON FINDINGS: Three sessile polyps ranging between 3-40mm in size were found in the ascending colon.  A polypectomy was performed with a cold snare and with cold forceps.  The resection was complete and the polyp tissue was completely retrieved.   Two sessile polyps ranging between 5-51mm in size were found at the hepatic flexure.  A polypectomy was performed.  The resection was complete and the polyp tissue was completely retrieved.   Severe diverticulosis was noted in the sigmoid colon.   Mild diverticulosis was noted in the descending colon.   A sessile polyp measuring 5 mm in size was found in the descending colon.  A polypectomy was performed with a cold snare.  The resection was complete and the polyp tissue was completely retrieved.   The colon mucosa was otherwise normal.  Retroflexed views revealed no abnormalities. The time to cecum=3 minutes 41  seconds.  Withdrawal time=11 minutes 32 seconds.  The scope was withdrawn and the procedure completed. COMPLICATIONS: There were no complications.  ENDOSCOPIC IMPRESSION: 1.   colonic polyposis 2.   diverticulosis  RECOMMENDATIONS: If the polyp(s) removed today are proven to be adenomatous (pre-cancerous) polyps, you will need a colonoscopy in 3 years. Otherwise you should continue to follow colorectal cancer screening guidelines for "routine risk" patients with a colonoscopy in 10 years.  You will receive a letter within 1-2 weeks with the results of your biopsy as well as final recommendations.  Please call my office if you have not received a letter after 3 weeks.   eSigned:  Louis Meckel, MD 09/13/2012 2:43 PM   cc:   PATIENT NAME:  Adrian, Jensen MR#: [id number]

## 2012-09-13 NOTE — Patient Instructions (Addendum)
Resume Plavix in 5 days  Colon polyps removed today. Handout on polyps given.   Call us with any questions or concerns. Thank you!!!  YOU HAD AN ENDOSCOPIC PROCEDURE TODAY AT THE [facility]: Refer to the procedure report that was given to you for any specific questions about what was found during the examination.  If the procedure report does not answer your questions, please call your gastroenterologist to clarify.  If you requested that your care partner not be given the details of your procedure findings, then the procedure report has been included in a sealed envelope for you to review at your convenience later.  YOU SHOULD EXPECT: Some feelings of bloating in the abdomen. Passage of more gas than usual.  Walking can help get rid of the air that was put into your GI tract during the procedure and reduce the bloating. If you had a lower endoscopy (such as a colonoscopy or flexible sigmoidoscopy) you may notice spotting of blood in your stool or on the toilet paper. If you underwent a bowel prep for your procedure, then you may not have a normal bowel movement for a few days.  DIET: Your first meal following the procedure should be a light meal and then it is ok to progress to your normal diet.  A half-sandwich or bowl of soup is an example of a good first meal.  Heavy or fried foods are harder to digest and may make you feel nauseous or bloated.  Likewise meals heavy in dairy and vegetables can cause extra gas to form and this can also increase the bloating.  Drink plenty of fluids but you should avoid alcoholic beverages for 24 hours.  ACTIVITY: Your care partner should take you home directly after the procedure.  You should plan to take it easy, moving slowly for the rest of the day.  You can resume normal activity the day after the procedure however you should NOT DRIVE or use heavy machinery for 24 hours (because of the sedation medicines used during the test).    SYMPTOMS TO  REPORT IMMEDIATELY: A gastroenterologist can be reached at any hour.  During normal business hours, 8:30 AM to 5:00 PM Monday through Friday, call [phone].  After hours and on weekends, please call the GI answering service at [phone] who will take a message and have the physician on call contact you.   Following lower endoscopy (colonoscopy or flexible sigmoidoscopy):  Excessive amounts of blood in the stool  Significant tenderness or worsening of abdominal pains  Swelling of the abdomen that is new, acute  Fever of 100F or higher  Following upper endoscopy (EGD)  Vomiting of blood or coffee ground material  New chest pain or pain under the shoulder blades  Painful or persistently difficult swallowing  New shortness of breath  Fever of 100F or higher  Black, tarry-looking stools  FOLLOW UP: If any biopsies were taken you will be contacted by phone or by letter within the next 1-3 weeks.  Call your gastroenterologist if you have not heard about the biopsies in 3 weeks.  Our staff will call the home number listed on your records the next business day following your procedure to check on you and address any questions or concerns that you may have at that time regarding the information given to you following your procedure. This is a courtesy call and so if there is no answer at the home number and we have not heard from you  through the emergency physician on call, we will assume that you have returned to your regular daily activities without incident.  SIGNATURES/CONFIDENTIALITY: You and/or your care partner have signed paperwork which will be entered into your electronic medical record.  These signatures attest to the fact that that the information above on your After Visit Summary has been reviewed and is understood.  Full responsibility of the confidentiality of this discharge information lies with you and/or your care-partner.

## 2012-09-13 NOTE — Progress Notes (Signed)
NO EGG OR SOY ALLERGY. EWM 

## 2012-09-14 ENCOUNTER — Telehealth: Payer: Self-pay

## 2012-09-14 NOTE — Telephone Encounter (Signed)
  Follow up Call-  Call back number 09/13/2012  Post procedure Call Back phone  # [phone] Naida Sleight [place] [id number] [place]  Permission to leave phone message Yes     Patient questions:  Do you have a fever, pain , or abdominal swelling? no Pain Score  0 *  Have you tolerated food without any problems? yes  Have you been able to return to your normal activities? yes  Do you have any questions about your discharge instructions: Diet   no Medications  no Follow up visit  no  Do you have questions or concerns about your Care? no  Actions: * If pain score is 4 or above: No action needed, pain <4.

## 2012-09-29 ENCOUNTER — Emergency Department (HOSPITAL_COMMUNITY)
Admission: EM | Admit: 2012-09-29 | Discharge: 2012-09-29 | Disposition: A | Discharge status: Home / Self Care | Payer: Medicare Other | Attending: Emergency Medicine | Admitting: Emergency Medicine

## 2012-09-29 ENCOUNTER — Encounter (HOSPITAL_COMMUNITY): Payer: Self-pay | Admitting: Emergency Medicine

## 2012-09-29 ENCOUNTER — Emergency Department (HOSPITAL_COMMUNITY): Payer: Medicare Other

## 2012-09-29 ENCOUNTER — Encounter: Payer: Self-pay | Admitting: Gastroenterology

## 2012-09-29 DIAGNOSIS — R61 Generalized hyperhidrosis: Secondary | ICD-10-CM | POA: Insufficient documentation

## 2012-09-29 DIAGNOSIS — R011 Cardiac murmur, unspecified: Secondary | ICD-10-CM | POA: Insufficient documentation

## 2012-09-29 DIAGNOSIS — I252 Old myocardial infarction: Secondary | ICD-10-CM | POA: Insufficient documentation

## 2012-09-29 DIAGNOSIS — E785 Hyperlipidemia, unspecified: Secondary | ICD-10-CM | POA: Insufficient documentation

## 2012-09-29 DIAGNOSIS — E039 Hypothyroidism, unspecified: Secondary | ICD-10-CM | POA: Insufficient documentation

## 2012-09-29 DIAGNOSIS — J45909 Unspecified asthma, uncomplicated: Secondary | ICD-10-CM | POA: Insufficient documentation

## 2012-09-29 DIAGNOSIS — I2510 Atherosclerotic heart disease of native coronary artery without angina pectoris: Secondary | ICD-10-CM | POA: Insufficient documentation

## 2012-09-29 DIAGNOSIS — Z9861 Coronary angioplasty status: Secondary | ICD-10-CM | POA: Insufficient documentation

## 2012-09-29 DIAGNOSIS — IMO0002 Reserved for concepts with insufficient information to code with codable children: Secondary | ICD-10-CM | POA: Insufficient documentation

## 2012-09-29 DIAGNOSIS — Z79899 Other long term (current) drug therapy: Secondary | ICD-10-CM | POA: Insufficient documentation

## 2012-09-29 DIAGNOSIS — Z7982 Long term (current) use of aspirin: Secondary | ICD-10-CM | POA: Insufficient documentation

## 2012-09-29 DIAGNOSIS — Z7902 Long term (current) use of antithrombotics/antiplatelets: Secondary | ICD-10-CM | POA: Insufficient documentation

## 2012-09-29 DIAGNOSIS — I10 Essential (primary) hypertension: Secondary | ICD-10-CM | POA: Insufficient documentation

## 2012-09-29 DIAGNOSIS — Z8551 Personal history of malignant neoplasm of bladder: Secondary | ICD-10-CM | POA: Insufficient documentation

## 2012-09-29 DIAGNOSIS — Z8719 Personal history of other diseases of the digestive system: Secondary | ICD-10-CM | POA: Insufficient documentation

## 2012-09-29 LAB — BASIC METABOLIC PANEL
GFR calc Af Amer: 79 mL/min — ABNORMAL LOW (ref >90)
GFR calc non Af Amer: 68 mL/min — ABNORMAL LOW (ref >90)
Potassium: 3.5 mEq/L (ref 3.5–5.1)
Sodium: 138 mEq/L (ref 135–145)

## 2012-09-29 LAB — POCT I-STAT, CHEM 8
BUN: 24 mg/dL — ABNORMAL HIGH (ref 6–23)
Calcium, Ion: 1.27 mmol/L (ref 1.13–1.30)
TCO2: 35 mmol/L (ref 0–100)

## 2012-09-29 LAB — URINALYSIS, ROUTINE W REFLEX MICROSCOPIC
Glucose, UA: NEGATIVE mg/dL
Ketones, ur: NEGATIVE mg/dL
Leukocytes, UA: NEGATIVE
Protein, ur: NEGATIVE mg/dL

## 2012-09-29 LAB — CBC
MCHC: 33.4 g/dL (ref 30.0–36.0)
Platelets: 122 10*3/uL — ABNORMAL LOW (ref 150–400)
RDW: 13.5 % (ref 11.5–15.5)

## 2012-09-29 LAB — POCT I-STAT TROPONIN I
Troponin i, poc: 0 ng/mL (ref 0.00–0.08)
Troponin i, poc: 0 ng/mL (ref 0.00–0.08)

## 2012-09-29 NOTE — ED Provider Notes (Addendum)
History     CSN: [id number]  Arrival date & time 09/29/12  4098   First MD Initiated Contact with Patient 09/29/12 [phone]      Chief Complaint  Patient presents with  . Sweating     (Consider location/radiation/quality/duration/timing/severity/associated sxs/prior treatment) HPI Complains of breaking out into cold sweat 6 AM today symptoms lasted approximately one hour resolved spontaneously without treatment onset while driving. No other associated symptoms.No weakness No shortness of breath no nausea no chest pain no pain anywhere he is presently asymptomatic. Symptoms similar to "heart attack" he had in 2009 , however in 2009 he suffered general malaise accompanied by sweating this. Presently patient feels well. Nothing makes symptoms better or worse. Past Medical History  Diagnosis Date  . Coronary artery disease   . Hypertension   . Hyperlipidemia   . GERD (gastroesophageal reflux disease)   . Hypothyroidism   . Personal history of malignant neoplasm of bladder   . Asthma   . Myocardial infarction     2009 WITH STENTS    Past Surgical History  Procedure Laterality Date  . Coronary stent placement      of the right coronary artery and left anterior descending  . Bladder surgery    . Liver biopsy    . Ankle surgery      left  . Cystourethroscopy    . Tonsillectomy    . Colonoscopy      Family History  Problem Relation Age of Onset  . Other      both of his parents were in their mid 16s when they died, neither one had heart disease  . Heart disease Brother     he developed heart disease after the age of 69  . Prostate cancer Father   . Breast cancer Sister   . Colon cancer Neg Hx   . Rectal cancer Neg Hx   . Stomach cancer Neg Hx    heart murmur  History  Substance Use Topics  . Smoking status: Never Smoker   . Smokeless tobacco: Never Used  . Alcohol Use: Yes     Comment: seldom      Review of Systems  Constitutional: Negative.   HENT: Negative.    Respiratory: Negative.   Cardiovascular: Negative.   Gastrointestinal: Negative.   Musculoskeletal: Negative.   Skin:       Diaphoresis  Neurological: Negative.   Psychiatric/Behavioral: Negative.   All other systems reviewed and are negative.    Allergies  Review of patient's allergies indicates no known allergies.  Home Medications   Current Outpatient Rx  Name  Route  Sig  Dispense  Refill  . aspirin 81 MG tablet   Oral   Take 81 mg by mouth every evening.          Marland Kitchen atorvastatin (LIPITOR) 40 MG tablet   Oral   Take 40 mg by mouth every evening.         . clopidogrel (PLAVIX) 75 MG tablet   Oral   Take 75 mg by mouth daily.         Marland Kitchen levothyroxine (SYNTHROID, LEVOTHROID) 50 MCG tablet   Oral   Take 50 mcg by mouth daily.           . methocarbamol (ROBAXIN) 500 MG tablet   Oral   Take 500 mg by mouth 3 (three) times daily as needed (muscle spasms).         . metoprolol succinate (TOPROL-XL) 25 MG 24 hr  tablet   Oral   Take 12.5 mg by mouth 2 (two) times daily.         . mometasone (NASONEX) 50 MCG/ACT nasal spray   Nasal   Place 2 sprays into the nose as needed (congestion).          . nitroGLYCERIN (NITROSTAT) 0.4 MG SL tablet   Sublingual   Place 0.4 mg under the tongue every 5 (five) minutes as needed for chest pain.          . polyethylene glycol (MIRALAX / GLYCOLAX) packet   Oral   Take 17 g by mouth daily.         . vitamin B-12 (CYANOCOBALAMIN) 1000 MCG tablet   Oral   Take 1,000 mcg by mouth daily.           BP 155/70  Pulse 73  Temp(Src) 97.8 F (36.6 C) (Oral)  Resp 16  Ht 5\' 10"  (1.778 m)  Wt 182 lb (82.555 kg)  BMI 26.11 kg/m2  SpO2 100%  Physical Exam  Nursing note and vitals reviewed. Constitutional: He appears well-developed and well-nourished.  HENT:  Head: Normocephalic and atraumatic.  Eyes: Conjunctivae are normal. Pupils are equal, round, and reactive to light.  Neck: Neck supple. No tracheal  deviation present. No thyromegaly present.  Cardiovascular: Normal rate and regular rhythm.   Murmur heard. 2/6 systolic murmur left sternal border  Pulmonary/Chest: Effort normal and breath sounds normal.  Abdominal: Soft. Bowel sounds are normal. He exhibits no distension. There is no tenderness.  Musculoskeletal: Normal range of motion. He exhibits no edema and no tenderness.  Neurological: He is alert. Coordination normal.  Skin: Skin is warm and dry. No rash noted.  Psychiatric: He has a normal mood and affect.    ED Course  Procedures (including critical care time)  Labs Reviewed  CBC - Abnormal; Notable for the following:    Platelets 122 (*)    All other components within normal limits  BASIC METABOLIC PANEL  PRO B NATRIURETIC PEPTIDE  TROPONIN I   No results found.   No diagnosis found.   Date: 09/29/2012  Rate: 70  Rhythm: normal sinus rhythm and premature ventricular contractions (PVC)  QRS Axis: normal  Intervals: normal  ST/T Wave abnormalities: nonspecific T wave changes  Conduction Disutrbances:none  Narrative Interpretation:   Old EKG Reviewed: Unchanged from 09/22/2010 interpreted by me  Chest xray viewed by me  Results for orders placed during the hospital encounter of 09/29/12  CBC      Result Value Range   WBC 6.3  4.0 - 10.5 K/uL   RBC 4.82  4.22 - 5.81 MIL/uL   Hemoglobin 13.6  13.0 - 17.0 g/dL   HCT 09.8  11.9 - 14.7 %   MCV 84.4  78.0 - 100.0 fL   MCH 28.2  26.0 - 34.0 pg   MCHC 33.4  30.0 - 36.0 g/dL   RDW 82.9  56.2 - 13.0 %   Platelets 122 (*) 150 - 400 K/uL  BASIC METABOLIC PANEL      Result Value Range   Sodium 138  135 - 145 mEq/L   Potassium 3.5  3.5 - 5.1 mEq/L   Chloride 104  96 - 112 mEq/L   CO2 27  19 - 32 mEq/L   Glucose, Bld 93  70 - 99 mg/dL   BUN 20  6 - 23 mg/dL   Creatinine, Ser 8.65  0.50 - 1.35 mg/dL   Calcium 9.2  8.4 - 10.5 mg/dL   GFR calc non Af Amer 68 (*) >90 mL/min   GFR calc Af Amer 79 (*) >90 mL/min   PRO B NATRIURETIC PEPTIDE      Result Value Range   Pro B Natriuretic peptide (BNP) 140.5  0 - 450 pg/mL  URINALYSIS, ROUTINE W REFLEX MICROSCOPIC      Result Value Range   Color, Urine YELLOW  YELLOW   APPearance CLEAR  CLEAR   Specific Gravity, Urine 1.012  1.005 - 1.030   pH 6.5  5.0 - 8.0   Glucose, UA NEGATIVE  NEGATIVE mg/dL   Hgb urine dipstick TRACE (*) NEGATIVE   Bilirubin Urine NEGATIVE  NEGATIVE   Ketones, ur NEGATIVE  NEGATIVE mg/dL   Protein, ur NEGATIVE  NEGATIVE mg/dL   Urobilinogen, UA 0.2  0.0 - 1.0 mg/dL   Nitrite NEGATIVE  NEGATIVE   Leukocytes, UA NEGATIVE  NEGATIVE  URINE MICROSCOPIC-ADD ON      Result Value Range   WBC, UA 0-2  <3 WBC/hpf   RBC / HPF 0-2  <3 RBC/hpf   Bacteria, UA RARE  RARE  POCT I-STAT TROPONIN I      Result Value Range   Troponin i, poc 0.00  0.00 - 0.08 ng/mL   Comment 3           POCT I-STAT, CHEM 8      Result Value Range   Sodium 143  135 - 145 mEq/L   Potassium 3.6  3.5 - 5.1 mEq/L   Chloride 108  96 - 112 mEq/L   BUN 24 (*) 6 - 23 mg/dL   Creatinine, Ser 2.84  0.50 - 1.35 mg/dL   Glucose, Bld 85  70 - 99 mg/dL   Calcium, Ion 1.32  4.40 - 1.30 mmol/L   TCO2 35  0 - 100 mmol/L   Hemoglobin 13.6  13.0 - 17.0 g/dL   HCT 10.2  72.5 - 36.6 %  POCT I-STAT TROPONIN I      Result Value Range   Troponin i, poc 0.00  0.00 - 0.08 ng/mL   Comment 3            Dg Chest 2 View  09/29/2012   *RADIOLOGY REPORT*  Clinical Data: Chest pain  CHEST - 2 VIEW  Comparison: 12/28/2008  Findings: Cardiomediastinal silhouette is stable.  Mild upper thoracic dextroscoliosis.  Small hiatal hernia with air-fluid level.  No acute infiltrate or pleural effusion.  No pulmonary edema.  Mild degenerative changes thoracic spine.  IMPRESSION: Small hiatal hernia with air-fluid level.  No active disease.   Original Report Authenticated By: Natasha Mead, M.D.    10:45 AM patient remains asymptomatic MDM  Symptoms may be consistent with anginal equivalent.  Patient's episode brief and self limiting. I spoke with Dr.Nishan plan patient will be seen at [facility] cardiology office this week He is instructed to go to [facility] emergency department if symptoms recur or were or worse Dx #1diaphoresis #2 hypertension        Doug Sou, MD 09/29/12 1050  Doug Sou, MD 09/29/12 1553

## 2012-09-29 NOTE — ED Notes (Signed)
Pt stated in 2009 he had a heart attack that began "by feeling hot and sweating." Pt began feeling hot this morning upon waking at 0600.  Pt denies pain, but stated he did not have pain with his previous MI.  Pt denies feeling ShOB, lightheaded, dizzy, or nauseated.

## 2012-10-01 ENCOUNTER — Ambulatory Visit (INDEPENDENT_AMBULATORY_CARE_PROVIDER_SITE_OTHER): Payer: Medicare Other | Admitting: Cardiovascular Disease

## 2012-10-01 ENCOUNTER — Encounter: Payer: Self-pay | Admitting: Cardiovascular Disease

## 2012-10-01 VITALS — BP 147/87 | HR 79 | Ht 70.0 in | Wt 186.8 lb

## 2012-10-01 DIAGNOSIS — R079 Chest pain, unspecified: Secondary | ICD-10-CM

## 2012-10-01 DIAGNOSIS — I2510 Atherosclerotic heart disease of native coronary artery without angina pectoris: Secondary | ICD-10-CM

## 2012-10-01 NOTE — Progress Notes (Signed)
Patient ID: KASEM MOZER, male   DOB: [birth year], 79 y.o.   MRN: [id number] 79 yo patient new to me Previously seen by TS  History of IMI and CAD   PROCEDURES:  2009 1. Left heart cardiac catheterization with successful percutaneous  coronary intervention and stenting of the proximal mid and distal  right coronary artery with placement of three Vision bare metal  stents.  2. Percutaneous coronary intervention and stenting of the left  anterior descending with placement of a 2.5 x 18-mm, Promus drug-  eluting stent.  3. Percutaneous coronary intervention and stenting of the ramus with a  2.5 x 18-mm, Promus thrombus drug-eluting stent.  Was in ER 6/5 with flushing episode.  Did not feel well in am  Had headache No fever.  Previous MI no chest pain just similar warmth and flushing. Been fine since discharge  In ER no acute ECG changes and negative enzymes.    ROS: Denies fever, malais, weight loss, blurry vision, decreased visual acuity, cough, sputum, SOB, hemoptysis, pleuritic pain, palpitaitons, heartburn, abdominal pain, melena, lower extremity edema, claudication, or rash.  All other systems reviewed and negative  General: Affect appropriate Healthy:  appears stated age HEENT: normal Neck supple with no adenopathy JVP normal no bruits no thyromegaly Lungs clear with no wheezing and good diaphragmatic motion Heart:  S1/S2 no murmur, no rub, gallop or click PMI normal Abdomen: benighn, BS positve, no tenderness, no AAA no bruit.  No HSM or HJR Distal pulses intact with no bruits No edema Neuro non-focal Skin warm and dry Polio right arm with loss of deltoid function   Current Outpatient Prescriptions  Medication Sig Dispense Refill  . aspirin 81 MG tablet Take 81 mg by mouth every evening.       Marland Kitchen atorvastatin (LIPITOR) 40 MG tablet Take 40 mg by mouth every evening.      . clopidogrel (PLAVIX) 75 MG tablet Take 75 mg by mouth daily.      Marland Kitchen levothyroxine (SYNTHROID,  LEVOTHROID) 50 MCG tablet Take 50 mcg by mouth daily.        . metoprolol succinate (TOPROL-XL) 25 MG 24 hr tablet Take 12.5 mg by mouth 2 (two) times daily.      . mometasone (NASONEX) 50 MCG/ACT nasal spray Place 2 sprays into the nose as needed (congestion).       . nitroGLYCERIN (NITROSTAT) 0.4 MG SL tablet Place 0.4 mg under the tongue every 5 (five) minutes as needed for chest pain.       . polyethylene glycol (MIRALAX / GLYCOLAX) packet Take 17 g by mouth daily.      . vitamin B-12 (CYANOCOBALAMIN) 1000 MCG tablet Take 1,000 mcg by mouth daily.       No current facility-administered medications for this visit.    Allergies  Review of patient's allergies indicates no known allergies.  Electrocardiogram: 6/5  SR rate 72 inferior T wave changes PVC  Assessment and Plan

## 2012-10-01 NOTE — Assessment & Plan Note (Signed)
Cholesterol is at goal.  Continue current dose of statin and diet Rx.  No myalgias or side effects.  F/U  LFT's in 6 months. Lab Results  Component Value Date   LDLCALC 82 01/09/2009

## 2012-10-01 NOTE — Assessment & Plan Note (Signed)
Previous atypical symptoms Lots of stents.  Baseline ECG with inferior changes.  F/U stress myovue  Continue beta blocker asa and plavix

## 2012-10-01 NOTE — Assessment & Plan Note (Signed)
Well controlled.  Continue current medications and low sodium Dash type diet.    

## 2012-10-01 NOTE — Patient Instructions (Addendum)
Your physician has requested that you have en exercise stress myoview. For further information please visit [email or web address]. Please follow instruction sheet, as given.  Your physician wants you to follow-up in: 6 months. You will receive a reminder letter in the mail two months in advance. If you don't receive a letter, please call our office to schedule the follow-up appointment.  

## 2012-10-07 ENCOUNTER — Encounter: Payer: Self-pay | Admitting: Cardiology

## 2012-10-11 ENCOUNTER — Ambulatory Visit (HOSPITAL_COMMUNITY): Payer: Medicare Other | Attending: Cardiology | Admitting: Radiology

## 2012-10-11 VITALS — BP 130/76 | HR 55 | Ht 70.0 in | Wt 187.0 lb

## 2012-10-11 DIAGNOSIS — R079 Chest pain, unspecified: Secondary | ICD-10-CM

## 2012-10-11 DIAGNOSIS — Z9861 Coronary angioplasty status: Secondary | ICD-10-CM | POA: Insufficient documentation

## 2012-10-11 DIAGNOSIS — I2510 Atherosclerotic heart disease of native coronary artery without angina pectoris: Secondary | ICD-10-CM

## 2012-10-11 DIAGNOSIS — R232 Flushing: Secondary | ICD-10-CM | POA: Insufficient documentation

## 2012-10-11 DIAGNOSIS — I4949 Other premature depolarization: Secondary | ICD-10-CM

## 2012-10-11 DIAGNOSIS — I10 Essential (primary) hypertension: Secondary | ICD-10-CM | POA: Insufficient documentation

## 2012-10-11 DIAGNOSIS — E785 Hyperlipidemia, unspecified: Secondary | ICD-10-CM | POA: Insufficient documentation

## 2012-10-11 DIAGNOSIS — Z8249 Family history of ischemic heart disease and other diseases of the circulatory system: Secondary | ICD-10-CM | POA: Insufficient documentation

## 2012-10-11 DIAGNOSIS — I252 Old myocardial infarction: Secondary | ICD-10-CM | POA: Insufficient documentation

## 2012-10-11 MED ORDER — TECHNETIUM TC 99M SESTAMIBI GENERIC - CARDIOLITE
10.0000 | Freq: Once | INTRAVENOUS | Status: AC | PRN
  Administered 2012-10-11: 10 via INTRAVENOUS

## 2012-10-11 MED ORDER — TECHNETIUM TC 99M SESTAMIBI GENERIC - CARDIOLITE
30.0000 | Freq: Once | INTRAVENOUS | Status: AC | PRN
  Administered 2012-10-11: 30 via INTRAVENOUS

## 2012-10-11 NOTE — Progress Notes (Signed)
[facility] SITE 3 NUCLEAR MED [address] [place], [place] [address] [phone]    Cardiology Nuclear Med Study  Adrian Jensen is a 79 y.o. male     MRN : [id number]     DOB: 04/17/1933  Procedure Date: 10/11/2012  Nuclear Med Background Indication for Stress Test:  Evaluation for Ischemia, PTCA/Stent Patency and  Hospital ED on 09/30/12 with flushing episode/similar to previous MI, Enzymes were Negative History:  1/09 MI>PTCA/Stent X 3; 3/09 ZDG:LOVFIE; 10/09 PPI:RJJOACZY infarct, no ischemia, EF=50% Cardiac Risk Factors: Family History - CAD, Hypertension and Lipids  Symptoms:  Flushing episode, none since discharge   Nuclear Pre-Procedure Caffeine/Decaff Intake:  None NPO After: 8:30pm   Lungs:  Clear. O2 Sat: 97% on room air. IV 0.9% NS with Angio Cath:  20g  IV Site: L Antecubital  IV Started by:  Stanton Kidney, EMT-P  Chest Size (in):  42 Cup Size: n/a  Height: 5\' 10"  (1.778 m)  Weight:  187 lb (84.823 kg)  BMI:  Body mass index is 26.83 kg/(m^2). Tech Comments:  Toprol held > 24 hrs, per patient.    Nuclear Med Study 1 or 2 day study: 1 day  Stress Test Type:  Stress  Reading MD: Marca Ancona, MD  Order Authorizing Provider:  Charlton Haws, MD  Resting Radionuclide: Technetium 26m Sestamibi  Resting Radionuclide Dose: 11.0 mCi   Stress Radionuclide:  Technetium 76m Sestamibi  Stress Radionuclide Dose: 33.0 mCi           Stress Protocol Rest HR: 55 Stress HR: 142  Rest BP: 130/76 Stress BP: 168/72  Exercise Time (min): 7:01 METS: 8.5   Predicted Max HR: 143 bpm % Max HR: 99.3 bpm Rate Pressure Product: 60630   Dose of Adenosine (mg):  n/a Dose of Lexiscan: n/a mg  Dose of Atropine (mg): n/a Dose of Dobutamine: n/a mcg/kg/min (at max HR)  Stress Test Technologist: Smiley Houseman, CMA-N  Nuclear Technologist:  Doyne Keel, CNMT     Rest Procedure:  Myocardial perfusion imaging was performed at rest 45 minutes following the intravenous  administration of Technetium 86m Sestamibi.  Rest ECG: NSR - Normal EKG  Stress Procedure:  The patient exercised on the treadmill utilizing the Bruce Protocol for 7:01 minutes. The patient stopped due to fatigue and dyspnea.  He denied any chest pain.  Technetium 41m Sestamibi was injected at peak exercise and myocardial perfusion imaging was performed after a brief delay.  EKG's and images were discussed with Dr. Clifton James and he felt it was safe for patient to leave and follow up with Dr. Eden Emms.  Stress ECG: Significant ST abnormalities consistent with ischemia.  QPS Raw Data Images:  Normal; no motion artifact; normal heart/lung ratio. Stress Images:  Medium-sized, severe basal to mid inferior, basal inferoseptal, and basal inferolateral perfusion defect.  Rest Images:  Same as stress.  Subtraction (SDS):  Fixed medium-sized, severe basal to mid inferior, basal inferoseptal, and basal inferolateral perfusion defect.  Transient Ischemic Dilatation (Normal <1.22):  1.00 Lung/Heart Ratio (Normal <0.45):  0.30  Quantitative Gated Spect Images QGS EDV:  106 ml QGS ESV:  63 ml  Impression Exercise Capacity:  Fair exercise capacity. BP Response:  Normal blood pressure response. Clinical Symptoms:  Fatigue, no chest pain.  ECG Impression:  1-2 mm horizontal ST depression in leads V4-V6 resolved completely by 3 minutes in recovery.  PVCs frequently in recovery.  Comparison with Prior Nuclear Study: No significant change from previous study  Overall Impression:  Intermediate risk stress nuclear study with fixed, medium-sized, severe basal to mid inferior, basal inferoseptal, and basal inferolateral perfusion defect suggestive of prior infarction with no ischemia.  There were ischemic ECG changes with Duke Treadmill Score -7.  .  LV Ejection Fraction: 40%.  LV Wall Motion:  Hypokinesis of the inferior and inferolateral walls.   Marca Ancona 10/11/2012

## 2012-10-14 ENCOUNTER — Other Ambulatory Visit: Payer: Self-pay | Admitting: Cardiovascular Disease

## 2012-10-14 ENCOUNTER — Other Ambulatory Visit: Payer: Self-pay | Admitting: *Deleted

## 2012-10-14 ENCOUNTER — Encounter: Payer: Self-pay | Admitting: *Deleted

## 2012-10-14 ENCOUNTER — Encounter (HOSPITAL_COMMUNITY): Payer: Self-pay | Admitting: Pharmacy Technician

## 2012-10-14 DIAGNOSIS — Z01810 Encounter for preprocedural cardiovascular examination: Secondary | ICD-10-CM

## 2012-10-14 DIAGNOSIS — R9439 Abnormal result of other cardiovascular function study: Secondary | ICD-10-CM

## 2012-10-14 DIAGNOSIS — R079 Chest pain, unspecified: Secondary | ICD-10-CM

## 2012-10-15 ENCOUNTER — Other Ambulatory Visit (INDEPENDENT_AMBULATORY_CARE_PROVIDER_SITE_OTHER): Payer: Medicare Other

## 2012-10-15 DIAGNOSIS — I2510 Atherosclerotic heart disease of native coronary artery without angina pectoris: Secondary | ICD-10-CM

## 2012-10-15 DIAGNOSIS — Z01810 Encounter for preprocedural cardiovascular examination: Secondary | ICD-10-CM

## 2012-10-15 DIAGNOSIS — R9439 Abnormal result of other cardiovascular function study: Secondary | ICD-10-CM

## 2012-10-15 LAB — BASIC METABOLIC PANEL
BUN: 20 mg/dL (ref 6–23)
CO2: 28 mEq/L (ref 19–32)
Chloride: 105 mEq/L (ref 96–112)
Potassium: 4 mEq/L (ref 3.5–5.1)

## 2012-10-15 LAB — PROTIME-INR
INR: 1 ratio (ref 0.8–1.0)
Prothrombin Time: 10.6 s (ref 10.2–12.4)

## 2012-10-16 LAB — CBC WITH DIFFERENTIAL/PLATELET
Basophils Absolute: 0 10*3/uL (ref 0.0–0.1)
Basophils Relative: 0.5 % (ref 0.0–3.0)
Eosinophils Absolute: 0.1 10*3/uL (ref 0.0–0.7)
Hemoglobin: 14.4 g/dL (ref 13.0–17.0)
Lymphocytes Relative: 27.2 % (ref 12.0–46.0)
MCHC: 34.2 g/dL (ref 30.0–36.0)
Monocytes Relative: 12.1 % — ABNORMAL HIGH (ref 3.0–12.0)
Neutrophils Relative %: 58.3 % (ref 43.0–77.0)
RBC: 4.8 Mil/uL (ref 4.22–5.81)

## 2012-10-19 ENCOUNTER — Encounter (HOSPITAL_COMMUNITY)
Admission: RE | Disposition: A | Discharge status: Home / Self Care | Payer: Self-pay | Source: Ambulatory Visit | Attending: Cardiology

## 2012-10-19 ENCOUNTER — Ambulatory Visit (HOSPITAL_COMMUNITY)
Admission: RE | Admit: 2012-10-19 | Discharge: 2012-10-19 | Disposition: A | Discharge status: Home / Self Care | Payer: Medicare Other | Source: Ambulatory Visit | Attending: Cardiology | Admitting: Cardiology

## 2012-10-19 DIAGNOSIS — I2510 Atherosclerotic heart disease of native coronary artery without angina pectoris: Secondary | ICD-10-CM | POA: Insufficient documentation

## 2012-10-19 DIAGNOSIS — E039 Hypothyroidism, unspecified: Secondary | ICD-10-CM | POA: Insufficient documentation

## 2012-10-19 DIAGNOSIS — R9439 Abnormal result of other cardiovascular function study: Secondary | ICD-10-CM | POA: Insufficient documentation

## 2012-10-19 DIAGNOSIS — Z9861 Coronary angioplasty status: Secondary | ICD-10-CM | POA: Insufficient documentation

## 2012-10-19 DIAGNOSIS — I252 Old myocardial infarction: Secondary | ICD-10-CM | POA: Insufficient documentation

## 2012-10-19 DIAGNOSIS — J45909 Unspecified asthma, uncomplicated: Secondary | ICD-10-CM | POA: Insufficient documentation

## 2012-10-19 DIAGNOSIS — R079 Chest pain, unspecified: Secondary | ICD-10-CM

## 2012-10-19 DIAGNOSIS — E785 Hyperlipidemia, unspecified: Secondary | ICD-10-CM | POA: Insufficient documentation

## 2012-10-19 DIAGNOSIS — I10 Essential (primary) hypertension: Secondary | ICD-10-CM | POA: Insufficient documentation

## 2012-10-19 DIAGNOSIS — Z8551 Personal history of malignant neoplasm of bladder: Secondary | ICD-10-CM | POA: Insufficient documentation

## 2012-10-19 DIAGNOSIS — K219 Gastro-esophageal reflux disease without esophagitis: Secondary | ICD-10-CM | POA: Insufficient documentation

## 2012-10-19 DIAGNOSIS — I2582 Chronic total occlusion of coronary artery: Secondary | ICD-10-CM | POA: Insufficient documentation

## 2012-10-19 HISTORY — PX: LEFT HEART CATHETERIZATION WITH CORONARY ANGIOGRAM: SHX5451

## 2012-10-19 SURGERY — LEFT HEART CATHETERIZATION WITH CORONARY ANGIOGRAM
Anesthesia: LOCAL

## 2012-10-19 MED ORDER — LIDOCAINE HCL (PF) 1 % IJ SOLN
INTRAMUSCULAR | Status: AC
  Filled 2012-10-19: qty 30

## 2012-10-19 MED ORDER — NITROGLYCERIN 0.2 MG/ML ON CALL CATH LAB
INTRAVENOUS | Status: AC
  Filled 2012-10-19: qty 1

## 2012-10-19 MED ORDER — ACETAMINOPHEN 325 MG PO TABS: 650.0000 mg | ORAL_TABLET | ORAL | Status: DC | PRN

## 2012-10-19 MED ORDER — SODIUM CHLORIDE 0.9 % IV SOLN: 1.0000 mL/kg/h | INTRAVENOUS | Status: DC

## 2012-10-19 MED ORDER — HEPARIN (PORCINE) IN NACL 2-0.9 UNIT/ML-% IJ SOLN
INTRAMUSCULAR | Status: AC
  Filled 2012-10-19: qty 1000

## 2012-10-19 MED ORDER — ASPIRIN 81 MG PO CHEW
324.0000 mg | CHEWABLE_TABLET | ORAL | Status: AC
  Administered 2012-10-19: 324 mg via ORAL

## 2012-10-19 MED ORDER — ASPIRIN 81 MG PO CHEW
CHEWABLE_TABLET | ORAL | Status: AC
  Filled 2012-10-19: qty 4

## 2012-10-19 MED ORDER — SODIUM CHLORIDE 0.9 % IV SOLN
INTRAVENOUS | Status: DC
  Administered 2012-10-19: 08:00:00 via INTRAVENOUS

## 2012-10-19 MED ORDER — SODIUM CHLORIDE 0.9 % IV SOLN
250.0000 mL | INTRAVENOUS | Status: DC | PRN
Start: 2012-10-19 — End: 2012-10-19

## 2012-10-19 MED ORDER — MIDAZOLAM HCL 2 MG/2ML IJ SOLN
INTRAMUSCULAR | Status: AC
  Filled 2012-10-19: qty 2

## 2012-10-19 MED ORDER — FENTANYL CITRATE 0.05 MG/ML IJ SOLN
INTRAMUSCULAR | Status: AC
  Filled 2012-10-19: qty 2

## 2012-10-19 MED ORDER — SODIUM CHLORIDE 0.9 % IJ SOLN: 3.0000 mL | INTRAMUSCULAR | Status: DC | PRN

## 2012-10-19 MED ORDER — ONDANSETRON HCL 4 MG/2ML IJ SOLN: 4.0000 mg | Freq: Four times a day (QID) | INTRAMUSCULAR | Status: DC | PRN

## 2012-10-19 MED ORDER — VERAPAMIL HCL 2.5 MG/ML IV SOLN
INTRAVENOUS | Status: AC
  Filled 2012-10-19: qty 2

## 2012-10-19 MED ORDER — SODIUM CHLORIDE 0.9 % IJ SOLN: 3.0000 mL | Freq: Two times a day (BID) | INTRAMUSCULAR | Status: DC

## 2012-10-19 NOTE — CV Procedure (Signed)
   Cardiac Catheterization Procedure Note  Name: Adrian Jensen MRN: [id number] DOB: [birth year]  Procedure: Left Heart Cath, Selective Coronary Angiography, LV angiography  Indication: 79 year old white male with history of coronary disease. He is status post stenting of the proximal, mid, and distal RCA in 2009 for a STEMI. The LAD and ramus intermediate branch were also stented. He had recent symptoms of flushing without significant chest pain. A stress Myoview study was intermediate risk with a fixed inferior and inferoseptal defect. Ejection fraction was 40%.   Procedural Details: The right wrist was prepped, draped, and anesthetized with 1% lidocaine. Using the modified Seldinger technique, a 5 French sheath was introduced into the right radial artery. 3 mg of verapamil was administered through the sheath, weight-based unfractionated heparin was administered intravenously. Standard Judkins catheters were used for selective coronary angiography and left ventriculography. Catheter exchanges were performed over an exchange length guidewire. There were no immediate procedural complications. A TR band was used for radial hemostasis at the completion of the procedure.  The patient was transferred to the post catheterization recovery area for further monitoring.  Procedural Findings: Hemodynamics: AO 146/69 with a mean of 101 mmHg LV 145/9 mmHg  Coronary angiography: Coronary dominance: right  Left mainstem: The left main coronary is moderately calcified. It has mild diffuse disease up to 20%.  Left anterior descending (LAD): The left anterior descending artery is a large vessel extending to the apex. It gives rise to a single moderate-sized diagonal branch. The stented segment in the proximal LAD has diffuse disease up to 30%. The first diagonal is without significant disease.  The ramus intermediate branch has a stent in the proximal vessel. It has mild diffuse disease up to 20%.  Left  circumflex (LCx): The left circumflex is a relatively small vessel giving rise to a single small marginal branch. It has diffuse mild disease up to 30%.  Right coronary artery (RCA): The right coronary is occluded at the ostium. The entire right coronary fills by excellent left-to-right collaterals up to the mid vessel stent.  Left ventriculography: Left ventricular systolic function is normal, LVEF is estimated at 45%, there is inferior basal hypokinesis, there is no significant mitral regurgitation   Final Conclusions:   1. Single vessel occlusive coronary disease. The right coronary is occluded at the ostium. He does have excellent left-to-right collaterals. The stents in the LAD and ramus intermediate branch are patent. 2. Mild left ventricular dysfunction.  Recommendations: Continue medical management.  Theron Arista [facility] 10/19/2012, 9:56 AM

## 2012-10-19 NOTE — H&P (Signed)
Patient ID: Adrian Jensen, male   DOB: 02/22/1933, 79 y.o.   MRN: [id number]  10/01/12 OFFICE NOTE BY DR. Eden Emms ADDENDED TO REFLECT CURRENT PRESENTATION.    79 yo patient new to me Previously seen by TS  History of IMI and CAD   PROCEDURES:  2009 1. Left heart cardiac catheterization with successful percutaneous  coronary intervention and stenting of the proximal mid and distal  right coronary artery with placement of three Vision bare metal  stents.  2. Percutaneous coronary intervention and stenting of the left  anterior descending with placement of a 2.5 x 18-mm, Promus drug-  eluting stent.  3. Percutaneous coronary intervention and stenting of the ramus with a  2.5 x 18-mm, Promus thrombus drug-eluting stent.  Was in ER 6/5 with flushing episode.  Did not feel well in am  Had headache No fever.  Previous MI no chest pain just similar warmth and flushing. Been fine since discharge  In ER no acute ECG changes and negative enzymes.    ROS: Denies fever, malais, weight loss, blurry vision, decreased visual acuity, cough, sputum, SOB, hemoptysis, pleuritic pain, palpitaitons, heartburn, abdominal pain, melena, lower extremity edema, claudication, or rash.  All other systems reviewed and negative  ADDENDUM: The patient was scheduled for an outpatient ETT Myoview. The results indicated intermediate risk- fixed, medium-sized, severe baseal to mid inferior, basal inferoseptal and basal inferoalteral perfusion defect suggestive of prior infarct with no ischemia, 1-2 mm horizontal ST depression V4-V6 on treadmill portion resolved completely by 3 minutes in recovery; EF 40%, HK of inferior/inferolateral walls. The results were relayed to Dr. Eden Emms and the recommendation made to proceed with diagnostic cardiac catheterization. The risks, benefits and details of the procedure were explained to the patient who agreed to proceed.   Past Medical History  Diagnosis Date  . Coronary artery disease   .  Hypertension   . Hyperlipidemia   . GERD (gastroesophageal reflux disease)   . Hypothyroidism   . Personal history of malignant neoplasm of bladder   . Asthma   . Myocardial infarction     2009 WITH STENTS   Past Surgical History  Procedure Laterality Date  . Coronary stent placement      of the right coronary artery and left anterior descending  . Bladder surgery    . Liver biopsy    . Ankle surgery      left  . Cystourethroscopy    . Tonsillectomy    . Colonoscopy      Family History  Problem Relation Age of Onset  . Other      both of his parents were in their mid 75s when they died, neither one had heart disease  . Heart disease Brother     he developed heart disease after the age of 75  . Prostate cancer Father   . Breast cancer Sister   . Colon cancer Neg Hx   . Rectal cancer Neg Hx   . Stomach cancer Neg Hx    History   Social History  . Marital Status: Married    Spouse Name: N/A    Number of Children: 2  . Years of Education: N/A   Occupational History  . retired    Social History Main Topics  . Smoking status: Never Smoker   . Smokeless tobacco: Never Used  . Alcohol Use: Yes     Comment: seldom  . Drug Use: No  . Sexually Active: Not on file   Other  Topics Concern  . Not on file   Social History Narrative  . No narrative on file     General: Affect appropriate Healthy:  appears stated age HEENT: normal Neck supple with no adenopathy JVP normal no bruits no thyromegaly Lungs clear with no wheezing and good diaphragmatic motion Heart:  S1/S2 no murmur, no rub, gallop or click PMI normal Abdomen: benighn, BS positve, no tenderness, no AAA no bruit.  No HSM or HJR Distal pulses intact with no bruits No edema Neuro non-focal Skin warm and dry Polio right arm with loss of deltoid function   Current Facility-Administered Medications  Medication Dose Route Frequency Provider Last Rate Last Dose  . 0.9 %  sodium chloride infusion    Intravenous Continuous Mattson Dayal M [place], MD 20 mL/hr at 10/19/12 0745    . 0.9 %  sodium chloride infusion  250 mL Intravenous PRN Wendall Stade, MD      . aspirin 81 MG chewable tablet           . sodium chloride 0.9 % injection 3 mL  3 mL Intravenous Q12H Wendall Stade, MD      . sodium chloride 0.9 % injection 3 mL  3 mL Intravenous PRN Wendall Stade, MD        Allergies  Review of patient's allergies indicates no known allergies.  Electrocardiogram: 6/5  SR rate 72 inferior T wave changes PVC  Assessment and Plan  79yo male with PMHx s/f CAD (s/p multiple PCIs), h/o bladder CA, HTN, HLD, GERD, hypothyroidism and asthma who presents today for diagnostic cardiac catheterization in the setting of an abnormal stress test.   1. Abnormal stress test  2. CAD s/p multiple PCIs 3. H/o bladder CA 4. HTN 5. HLD 6. GERD 7. Hypothyroidism 8. Asthma  The patient has arrived for this procedure. He will be informed and consented prior to undergoing the cardiac catheterization. Further plans will be determined by the interventionalist's findings at the time of the procedure.   Jacqulyn Bath, PA-C 10/19/2012 8:52 AM Patient seen and examined and history reviewed. Agree with above findings and plan. 79 yo WM with known CAD presents with atypical symptoms. Stress myoview is intermediate risk. Will proceed with cardiac cath/PCI.  Theron Arista [facility] 10/19/2012 9:29 AM

## 2012-10-19 NOTE — Interval H&P Note (Signed)
History and Physical Interval Note:  10/19/2012 9:31 AM  Adrian Jensen  has presented today for surgery, with the diagnosis of abnormal mioview  The various methods of treatment have been discussed with the patient and family. After consideration of risks, benefits and other options for treatment, the patient has consented to  Procedure(s): LEFT HEART CATHETERIZATION WITH CORONARY ANGIOGRAM (N/A) as a surgical intervention .  The patient's history has been reviewed, patient examined, no change in status, stable for surgery.  I have reviewed the patient's chart and labs.  Questions were answered to the patient's satisfaction.     Theron Arista [facility] 10/19/2012 9:31 AM

## 2012-10-23 ENCOUNTER — Encounter (HOSPITAL_COMMUNITY): Payer: Self-pay | Admitting: Emergency Medicine

## 2012-10-23 ENCOUNTER — Observation Stay (HOSPITAL_COMMUNITY)
Admission: EM | Admit: 2012-10-23 | Discharge: 2012-10-24 | Disposition: A | Discharge status: Home / Self Care | Payer: Medicare Other | Attending: Internal Medicine | Admitting: Internal Medicine

## 2012-10-23 DIAGNOSIS — I252 Old myocardial infarction: Secondary | ICD-10-CM | POA: Insufficient documentation

## 2012-10-23 DIAGNOSIS — E039 Hypothyroidism, unspecified: Secondary | ICD-10-CM | POA: Diagnosis present

## 2012-10-23 DIAGNOSIS — Z79899 Other long term (current) drug therapy: Secondary | ICD-10-CM | POA: Insufficient documentation

## 2012-10-23 DIAGNOSIS — I471 Supraventricular tachycardia, unspecified: Principal | ICD-10-CM

## 2012-10-23 DIAGNOSIS — I10 Essential (primary) hypertension: Secondary | ICD-10-CM

## 2012-10-23 DIAGNOSIS — R079 Chest pain, unspecified: Secondary | ICD-10-CM | POA: Insufficient documentation

## 2012-10-23 DIAGNOSIS — R61 Generalized hyperhidrosis: Secondary | ICD-10-CM

## 2012-10-23 DIAGNOSIS — I2510 Atherosclerotic heart disease of native coronary artery without angina pectoris: Secondary | ICD-10-CM

## 2012-10-23 DIAGNOSIS — I428 Other cardiomyopathies: Secondary | ICD-10-CM | POA: Insufficient documentation

## 2012-10-23 DIAGNOSIS — E785 Hyperlipidemia, unspecified: Secondary | ICD-10-CM | POA: Diagnosis present

## 2012-10-23 DIAGNOSIS — Z9861 Coronary angioplasty status: Secondary | ICD-10-CM | POA: Insufficient documentation

## 2012-10-23 LAB — CBC
Hemoglobin: 14.2 g/dL (ref 13.0–17.0)
MCH: 29.5 pg (ref 26.0–34.0)
MCHC: 34.5 g/dL (ref 30.0–36.0)
MCV: 85.3 fL (ref 78.0–100.0)

## 2012-10-23 LAB — CBC WITH DIFFERENTIAL/PLATELET
Eosinophils Absolute: 0.1 10*3/uL (ref 0.0–0.7)
Lymphocytes Relative: 26 % (ref 12–46)
Lymphs Abs: 2 10*3/uL (ref 0.7–4.0)
MCH: 30.1 pg (ref 26.0–34.0)
Neutro Abs: 4.7 10*3/uL (ref 1.7–7.7)
Neutrophils Relative %: 63 % (ref 43–77)
Platelets: 130 10*3/uL — ABNORMAL LOW (ref 150–400)
RBC: 4.89 MIL/uL (ref 4.22–5.81)
WBC: 7.6 10*3/uL (ref 4.0–10.5)

## 2012-10-23 LAB — COMPREHENSIVE METABOLIC PANEL
ALT: 39 U/L (ref 0–53)
Alkaline Phosphatase: 114 U/L (ref 39–117)
GFR calc Af Amer: 81 mL/min — ABNORMAL LOW (ref >90)
Glucose, Bld: 117 mg/dL — ABNORMAL HIGH (ref 70–99)
Potassium: 4 mEq/L (ref 3.5–5.1)
Sodium: 136 mEq/L (ref 135–145)
Total Protein: 6.6 g/dL (ref 6.0–8.3)

## 2012-10-23 LAB — CREATININE, SERUM: Creatinine, Ser: 1 mg/dL (ref 0.50–1.35)

## 2012-10-23 MED ORDER — ISOSORBIDE MONONITRATE ER 30 MG PO TB24
30.0000 mg | ORAL_TABLET | Freq: Every day | ORAL | Status: DC
  Administered 2012-10-24: 30 mg via ORAL
  Filled 2012-10-23: qty 1

## 2012-10-23 MED ORDER — SODIUM CHLORIDE 0.9 % IV SOLN
INTRAVENOUS | Status: DC
  Administered 2012-10-23: 17:00:00 via INTRAVENOUS

## 2012-10-23 MED ORDER — ASPIRIN EC 325 MG PO TBEC: 325.0000 mg | DELAYED_RELEASE_TABLET | Freq: Every day | ORAL | Status: DC

## 2012-10-23 MED ORDER — ACETAMINOPHEN 325 MG PO TABS: 650.0000 mg | ORAL_TABLET | Freq: Four times a day (QID) | ORAL | Status: DC | PRN

## 2012-10-23 MED ORDER — ENOXAPARIN SODIUM 40 MG/0.4ML ~~LOC~~ SOLN
40.0000 mg | SUBCUTANEOUS | Status: DC
  Administered 2012-10-23: 40 mg via SUBCUTANEOUS
  Filled 2012-10-23 (×2): qty 0.4

## 2012-10-23 MED ORDER — NITROGLYCERIN 0.4 MG SL SUBL: 0.4000 mg | SUBLINGUAL_TABLET | SUBLINGUAL | Status: DC | PRN

## 2012-10-23 MED ORDER — ACETAMINOPHEN 650 MG RE SUPP: 650.0000 mg | Freq: Four times a day (QID) | RECTAL | Status: DC | PRN

## 2012-10-23 MED ORDER — LEVOTHYROXINE SODIUM 50 MCG PO TABS
50.0000 ug | ORAL_TABLET | Freq: Every day | ORAL | Status: DC
  Administered 2012-10-24: 50 ug via ORAL
  Filled 2012-10-23 (×2): qty 1

## 2012-10-23 MED ORDER — ALBUTEROL SULFATE (5 MG/ML) 0.5% IN NEBU
2.5000 mg | INHALATION_SOLUTION | RESPIRATORY_TRACT | Status: DC
  Filled 2012-10-23: qty 0.5

## 2012-10-23 MED ORDER — POLYETHYLENE GLYCOL 3350 17 G PO PACK
17.0000 g | PACK | Freq: Every day | ORAL | Status: DC
  Administered 2012-10-23 – 2012-10-24 (×2): 17 g via ORAL
  Filled 2012-10-23 (×2): qty 1

## 2012-10-23 MED ORDER — IPRATROPIUM BROMIDE 0.02 % IN SOLN: 0.5000 mg | RESPIRATORY_TRACT | Status: DC | PRN

## 2012-10-23 MED ORDER — ISOSORBIDE MONONITRATE ER 30 MG PO TB24
30.0000 mg | ORAL_TABLET | Freq: Two times a day (BID) | ORAL | Status: DC
  Filled 2012-10-23 (×2): qty 1

## 2012-10-23 MED ORDER — IPRATROPIUM BROMIDE 0.02 % IN SOLN
0.5000 mg | RESPIRATORY_TRACT | Status: DC
  Filled 2012-10-23: qty 2.5

## 2012-10-23 MED ORDER — CLOPIDOGREL BISULFATE 75 MG PO TABS
75.0000 mg | ORAL_TABLET | Freq: Every day | ORAL | Status: DC
  Administered 2012-10-24: 75 mg via ORAL
  Filled 2012-10-23: qty 1

## 2012-10-23 MED ORDER — ONDANSETRON HCL 4 MG/2ML IJ SOLN: 4.0000 mg | Freq: Four times a day (QID) | INTRAMUSCULAR | Status: DC | PRN

## 2012-10-23 MED ORDER — ASPIRIN EC 81 MG PO TBEC
81.0000 mg | DELAYED_RELEASE_TABLET | Freq: Every day | ORAL | Status: DC
  Administered 2012-10-24: 81 mg via ORAL
  Filled 2012-10-23 (×2): qty 1

## 2012-10-23 MED ORDER — ATORVASTATIN CALCIUM 40 MG PO TABS
40.0000 mg | ORAL_TABLET | Freq: Every evening | ORAL | Status: DC
  Administered 2012-10-23: 40 mg via ORAL
  Filled 2012-10-23 (×2): qty 1

## 2012-10-23 MED ORDER — SODIUM CHLORIDE 0.9 % IJ SOLN
3.0000 mL | Freq: Two times a day (BID) | INTRAMUSCULAR | Status: DC
  Administered 2012-10-23 – 2012-10-24 (×2): 3 mL via INTRAVENOUS

## 2012-10-23 MED ORDER — ASPIRIN 81 MG PO TABS: 81.0000 mg | ORAL_TABLET | Freq: Every evening | ORAL | Status: DC

## 2012-10-23 MED ORDER — ALBUTEROL SULFATE (5 MG/ML) 0.5% IN NEBU: 2.5000 mg | INHALATION_SOLUTION | RESPIRATORY_TRACT | Status: DC | PRN

## 2012-10-23 MED ORDER — ONDANSETRON HCL 4 MG PO TABS: 4.0000 mg | ORAL_TABLET | Freq: Four times a day (QID) | ORAL | Status: DC | PRN

## 2012-10-23 MED ORDER — METOPROLOL SUCCINATE 12.5 MG HALF TABLET
12.5000 mg | ORAL_TABLET | Freq: Two times a day (BID) | ORAL | Status: DC
  Administered 2012-10-23 – 2012-10-24 (×2): 12.5 mg via ORAL
  Filled 2012-10-23 (×3): qty 1

## 2012-10-23 NOTE — ED Provider Notes (Signed)
History    CSN: [id number] Arrival date & time 10/23/12  1029  First MD Initiated Contact with Patient 10/23/12 1124     Chief Complaint  Patient presents with  . Excessive Sweating   (Consider location/radiation/quality/duration/timing/severity/associated sxs/prior Treatment) HPI Patient reports he has a history of coronary artery disease and has 3 stents in his right coronary artery. He states he's never had chest pain with his coronary artery disease.Marland Kitchen He states before he was having shortness of breath and lack of energy when he was found to have his prior blockages. He reports he started having lack of energy the past month and actually had a diaphoretic episode on June 4 and was admitted for a stress test which was positive. He then had a cardiac catheterization which showed a ostial blockage of his right coronary artery with collaterals. At that point he was told he should be treated medically. He states at 9:30 this morning he was sitting in his sun room sorting papers for about 30 minutes. He states he suddenly felt hot and sweaty and felt bad for about 60 minutes. He denies feeling lightheaded, dizzy, having chest pain, shortness of breath. He states he took 4 baby aspirin. He states he's feeling better now. He denies any swelling in his legs. He states his next appointment with his cardiologist is July 15.   PCP Dr Jacky Kindle Cardiologist Dr Eden Emms  Past Medical History  Diagnosis Date  . Coronary artery disease   . Hypertension   . Hyperlipidemia   . GERD (gastroesophageal reflux disease)   . Hypothyroidism   . Personal history of malignant neoplasm of bladder   . Asthma   . Myocardial infarction     2009 WITH STENTS   Past Surgical History  Procedure Laterality Date  . Coronary stent placement      of the right coronary artery and left anterior descending  . Bladder surgery    . Liver biopsy    . Ankle surgery      left  . Cystourethroscopy    . Tonsillectomy    .  Colonoscopy     Family History  Problem Relation Age of Onset  . Other      both of his parents were in their mid 53s when they died, neither one had heart disease  . Heart disease Brother     he developed heart disease after the age of 28  . Prostate cancer Father   . Breast cancer Sister   . Colon cancer Neg Hx   . Rectal cancer Neg Hx   . Stomach cancer Neg Hx    History  Substance Use Topics  . Smoking status: Never Smoker   . Smokeless tobacco: Never Used  . Alcohol Use: Yes     Comment: seldom  lives at home Lives with spouse  Review of Systems  All other systems reviewed and are negative.    Allergies  Review of patient's allergies indicates no known allergies.  Home Medications   Current Outpatient Rx  Name  Route  Sig  Dispense  Refill  . aspirin 81 MG tablet   Oral   Take 81 mg by mouth every evening.          Marland Kitchen atorvastatin (LIPITOR) 40 MG tablet   Oral   Take 40 mg by mouth every evening.         . clopidogrel (PLAVIX) 75 MG tablet   Oral   Take 75 mg by mouth daily.         Marland Kitchen  docusate sodium (COLACE) 100 MG capsule   Oral   Take 100 mg by mouth daily as needed for constipation.          . hydrocortisone (ANUSOL-HC) 25 MG suppository   Rectal   Place 25 mg rectally 2 (two) times daily.         Marland Kitchen levothyroxine (SYNTHROID, LEVOTHROID) 50 MCG tablet   Oral   Take 50 mcg by mouth daily.           . metoprolol succinate (TOPROL-XL) 25 MG 24 hr tablet   Oral   Take 12.5 mg by mouth 2 (two) times daily.         . mometasone (NASONEX) 50 MCG/ACT nasal spray   Nasal   Place 2 sprays into the nose daily as needed (congestion).          . nitroGLYCERIN (NITROSTAT) 0.4 MG SL tablet   Sublingual   Place 0.4 mg under the tongue every 5 (five) minutes as needed for chest pain.          . polyethylene glycol (MIRALAX / GLYCOLAX) packet   Oral   Take 17 g by mouth daily.         . vitamin B-12 (CYANOCOBALAMIN) 1000 MCG tablet    Oral   Take 500 mcg by mouth daily.           BP 142/75  Pulse 65  Resp 16  SpO2 96%  Vital signs normal   Physical Exam  Nursing note and vitals reviewed. Constitutional: He is oriented to person, place, and time. He appears well-developed and well-nourished.  Non-toxic appearance. He does not appear ill. No distress.  HENT:  Head: Normocephalic and atraumatic.  Right Ear: External ear normal.  Left Ear: External ear normal.  Nose: Nose normal. No mucosal edema or rhinorrhea.  Mouth/Throat: Oropharynx is clear and moist and mucous membranes are normal. No dental abscesses or edematous.  Eyes: Conjunctivae and EOM are normal. Pupils are equal, round, and reactive to light.  Neck: Normal range of motion and full passive range of motion without pain. Neck supple.  Cardiovascular: Normal rate and regular rhythm.  Exam reveals no gallop and no friction rub.   Murmur heard. Faint systolic murmer in the LSB  Pulmonary/Chest: Effort normal and breath sounds normal. No respiratory distress. He has no wheezes. He has no rhonchi. He has no rales. He exhibits no tenderness and no crepitus.  Abdominal: Soft. Normal appearance and bowel sounds are normal. He exhibits no distension. There is no tenderness. There is no rebound and no guarding.  Musculoskeletal: Normal range of motion. He exhibits no edema and no tenderness.  Moves all extremities well.   Neurological: He is alert and oriented to person, place, and time. He has normal strength. No cranial nerve deficit.  Skin: Skin is warm, dry and intact. No rash noted. No erythema. No pallor.  Psychiatric: He has a normal mood and affect. His speech is normal and behavior is normal. His mood appears not anxious.    ED Course  Procedures (including critical care time)    12:24 Dr Mayford Knife have hospitalist admit to obs for more enzymes  13:27 Dr Felipa Eth, states Dr Mayford Knife can admit or have hospitalist admit  13:41 Dr Susie Cassette, admit to tele,  team 10  10/11/2012 Stress Myoview Overall Impression: Intermediate risk stress nuclear study with fixed, medium-sized, severe basal to mid inferior, basal inferoseptal, and basal inferolateral perfusion defect suggestive of prior infarction with no  ischemia. There were ischemic ECG changes with Duke Treadmill Score -7. .  LV Ejection Fraction: 40%. LV Wall Motion: Hypokinesis of the inferior and inferolateral walls.  Marca Ancona  10/11/2012  Final Conclusions:  1. Single vessel occlusive coronary disease. The right coronary is occluded at the ostium. He does have excellent left-to-right collaterals. The stents in the LAD and ramus intermediate branch are patent.  2. Mild left ventricular dysfunction.  Recommendations: Continue medical management.  Theron Arista [facility]  10/19/2012, 9:56 AM   Results for orders placed during the hospital encounter of 10/23/12  CBC WITH DIFFERENTIAL      Result Value Range   WBC 7.6  4.0 - 10.5 K/uL   RBC 4.89  4.22 - 5.81 MIL/uL   Hemoglobin 14.7  13.0 - 17.0 g/dL   HCT 16.1  09.6 - 04.5 %   MCV 85.1  78.0 - 100.0 fL   MCH 30.1  26.0 - 34.0 pg   MCHC 35.3  30.0 - 36.0 g/dL   RDW 40.9  81.1 - 91.4 %   Platelets 130 (*) 150 - 400 K/uL   Neutrophils Relative % 63  43 - 77 %   Neutro Abs 4.7  1.7 - 7.7 K/uL   Lymphocytes Relative 26  12 - 46 %   Lymphs Abs 2.0  0.7 - 4.0 K/uL   Monocytes Relative 10  3 - 12 %   Monocytes Absolute 0.7  0.1 - 1.0 K/uL   Eosinophils Relative 2  0 - 5 %   Eosinophils Absolute 0.1  0.0 - 0.7 K/uL   Basophils Relative 0  0 - 1 %   Basophils Absolute 0.0  0.0 - 0.1 K/uL  COMPREHENSIVE METABOLIC PANEL      Result Value Range   Sodium 136  135 - 145 mEq/L   Potassium 4.0  3.5 - 5.1 mEq/L   Chloride 102  96 - 112 mEq/L   CO2 25  19 - 32 mEq/L   Glucose, Bld 117 (*) 70 - 99 mg/dL   BUN 18  6 - 23 mg/dL   Creatinine, Ser 7.82  0.50 - 1.35 mg/dL   Calcium 9.1  8.4 - 95.6 mg/dL   Total Protein 6.6  6.0 - 8.3 g/dL    Albumin 3.6  3.5 - 5.2 g/dL   AST 22  0 - 37 U/L   ALT 39  0 - 53 U/L   Alkaline Phosphatase 114  39 - 117 U/L   Total Bilirubin 0.5  0.3 - 1.2 mg/dL   GFR calc non Af Amer 70 (*) >90 mL/min   GFR calc Af Amer 81 (*) >90 mL/min  POCT I-STAT TROPONIN I      Result Value Range   Troponin i, poc 0.00  0.00 - 0.08 ng/mL   Comment 3            Laboratory interpretation all normal     Dg Chest 2 View  09/29/2012   *RADIOLOGY REPORT*  Clinical Data: Chest pain  CHEST - 2 VIEW  Comparison: 12/28/2008  Findings: Cardiomediastinal silhouette is stable.  Mild upper thoracic dextroscoliosis.  Small hiatal hernia with air-fluid level.  No acute infiltrate or pleural effusion.  No pulmonary edema.  Mild degenerative changes thoracic spine.  IMPRESSION: Small hiatal hernia with air-fluid level.  No active disease.   Original Report Authenticated By: Natasha Mead, M.D.    Date: 10/23/2012  Rate: 67  Rhythm: normal sinus rhythm  QRS Axis: normal  Intervals: normal  ST/T Wave abnormalities: normal  Conduction Disutrbances:none  Narrative Interpretation:   Old EKG Reviewed: unchanged from 09/29/2012     1. Diaphoresis   2. Coronary artery disease      Admit to observation   Devoria Albe, MD, FACEP   MDM           Ward Givens, MD 10/23/12 [phone]

## 2012-10-23 NOTE — ED Notes (Signed)
Patient advised that he had a cardiac cath on Wednesday. A blockage was found, today at approximately 10:00am he was sitting in the sun room when the episode  started. Patient states that he had been vacuuming approximately 30 minutes prior to diaphoresis onset. He denies CP, skin cool and dry at this time. Alert and Oriented NAD noted at this time

## 2012-10-23 NOTE — ED Notes (Signed)
Pt. Stated, I had a cardiac cath on Tuesday, but this morning I started sweating and hot all over and it was the same symptoms I had when I had my heart attack in 09. I had no pain then also.

## 2012-10-23 NOTE — ED Notes (Addendum)
Patient moved to Pod A room 11 and placed on all monitors. Denies pain at this time. Patient in a NSR

## 2012-10-23 NOTE — Consult Note (Addendum)
Admit date: 10/23/2012 Referring Physician  Dr. Susie Cassette Primary Physician  Dr. Jacky Kindle Primary Cardiologist  Dr. [place] Reason for Consultation  Chest pain  HPI: 79 year old male with a history of coronary artery disease status post stenting of the proximal and the distal RCA in 2009 for a STEMI. The patient recently had symptoms of flushing without significant chest pain. He had a stress Myoview that was intermediate risk EF was 40%. The patient had a cardiac catheterization done on 6/24 that showed single-vessel coronary artery disease with occluded RCA at ostium with excellent left to right collaterals filling the distal RCA to the mid RCA stent. Patient presents today with similar symptoms of sweating and feeling hot all over. Cardiology is now asked to consult.   PMH:   Past Medical History  Diagnosis Date  . Coronary artery disease with cath 10/19/2012 showing 20% LM, 30% prox LAD in prox stent, 20% in prox portion of stent in Ramus, 30% stenosis left circ, occluded RCA at ostium with good collateral blood flow from left to right collaterals up to the mid vessel stent, mild LV dysfunction EF 45% with inferobasal hypokinesis - medical management   . Hypertension   . Hyperlipidemia   . GERD (gastroesophageal reflux disease)   . Hypothyroidism   . Personal history of malignant neoplasm of bladder   . Asthma   . Myocardial infarction     2009 WITH STENTS in prox/mid and distal RCA as well as LAD and Ramus stents in setting of STEMI     PSH:   Past Surgical History  Procedure Laterality Date  . Coronary stent placement      of the right coronary artery and left anterior descending  . Bladder surgery    . Liver biopsy    . Ankle surgery      left  . Cystourethroscopy    . Tonsillectomy    . Colonoscopy      Allergies:  Review of patient's allergies indicates no known allergies. Prior to Admit Meds:   Prescriptions prior to admission  Medication Sig Dispense Refill  . aspirin 81 MG  tablet Take 81 mg by mouth every evening.       Marland Kitchen atorvastatin (LIPITOR) 40 MG tablet Take 40 mg by mouth every evening.      . clopidogrel (PLAVIX) 75 MG tablet Take 75 mg by mouth daily.      Marland Kitchen docusate sodium (COLACE) 100 MG capsule Take 100 mg by mouth daily as needed for constipation.       . hydrocortisone (ANUSOL-HC) 25 MG suppository Place 25 mg rectally 2 (two) times daily.      Marland Kitchen levothyroxine (SYNTHROID, LEVOTHROID) 50 MCG tablet Take 50 mcg by mouth daily.        . metoprolol succinate (TOPROL-XL) 25 MG 24 hr tablet Take 12.5 mg by mouth 2 (two) times daily.      . mometasone (NASONEX) 50 MCG/ACT nasal spray Place 2 sprays into the nose daily as needed (congestion).       . nitroGLYCERIN (NITROSTAT) 0.4 MG SL tablet Place 0.4 mg under the tongue every 5 (five) minutes as needed for chest pain.       . polyethylene glycol (MIRALAX / GLYCOLAX) packet Take 17 g by mouth daily.      . vitamin B-12 (CYANOCOBALAMIN) 1000 MCG tablet Take 500 mcg by mouth daily.        Fam HX:    Family History  Problem Relation Age of Onset  .  Other      both of his parents were in their mid 28s when they died, neither one had heart disease  . Heart disease Brother     he developed heart disease after the age of 35  . Prostate cancer Father   . Breast cancer Sister   . Colon cancer Neg Hx   . Rectal cancer Neg Hx   . Stomach cancer Neg Hx    Social HX:    History   Social History  . Marital Status: Married    Spouse Name: N/A    Number of Children: 2  . Years of Education: N/A   Occupational History  . retired    Social History Main Topics  . Smoking status: Never Smoker   . Smokeless tobacco: Never Used  . Alcohol Use: Yes     Comment: seldom  . Drug Use: No  . Sexually Active: Not on file   Other Topics Concern  . Not on file   Social History Narrative  . No narrative on file     ROS:  All 11 ROS were addressed and are negative except what is stated in the HPI  Physical  Exam: Blood pressure 145/78, pulse 61, temperature 97.7 F (36.5 C), temperature source Oral, resp. rate 16, SpO2 97.00%.    General: Well developed, well nourished, in no acute distress Head: Eyes PERRLA, No xanthomas.   Normal cephalic and atramatic  Lungs:   Clear bilaterally to auscultation and percussion. Heart:   HRRR S1 S2 Pulses are 2+ & equal.            No carotid bruit. No JVD.  No abdominal bruits. No femoral bruits. Abdomen: Bowel sounds are positive, abdomen soft and non-tender without masses  Extremities:   No clubbing, cyanosis or edema.  DP +1 Neuro: Alert and oriented X 3. Psych:  Good affect, responds appropriately    Labs:   Lab Results  Component Value Date   WBC 7.5 10/23/2012   HGB 14.2 10/23/2012   HCT 41.1 10/23/2012   MCV 85.3 10/23/2012   PLT 130* 10/23/2012    Recent Labs Lab 10/23/12 1046 10/23/12 1426  NA 136  --   K 4.0  --   CL 102  --   CO2 25  --   BUN 18  --   CREATININE 1.01 1.00  CALCIUM 9.1  --   PROT 6.6  --   BILITOT 0.5  --   ALKPHOS 114  --   ALT 39  --   AST 22  --   GLUCOSE 117*  --    No results found for this basename: PTT   Lab Results  Component Value Date   INR 1.0 10/15/2012   INR 0.9 12/28/2008   INR 0.9 02/09/2008   Lab Results  Component Value Date   CKTOTAL 124 09/22/2010   CKMB 3.0 09/22/2010   TROPONINI <0.30 10/23/2012     Lab Results  Component Value Date   CHOL 153 01/09/2009   CHOL  Value: 145        ATP III CLASSIFICATION:  <200     mg/dL   Desirable  161-096  mg/dL   Borderline High  >=045    mg/dL   High        4/0/9811   CHOL 149 10/22/2007   Lab Results  Component Value Date   HDL 46.10 01/09/2009   HDL 43 12/29/2008   HDL 41.0 10/22/2007  Lab Results  Component Value Date   LDLCALC 82 01/09/2009   LDLCALC  Value: 66        Total Cholesterol/HDL:CHD Risk Coronary Heart Disease Risk Table                     Men   Women  1/2 Average Risk   3.4   3.3  Average Risk       5.0   4.4  2 X Average Risk    9.6   7.1  3 X Average Risk  23.4   11.0        Use the calculated Patient Ratio above and the CHD Risk Table to determine the patient's CHD Risk.        ATP III CLASSIFICATION (LDL):  <100     mg/dL   Optimal  161-096  mg/dL   Near or Above                    Optimal  130-159  mg/dL   Borderline  045-409  mg/dL   High  >811     mg/dL   Very High 12/27/4780   LDLCALC 83 10/22/2007   Lab Results  Component Value Date   TRIG 123.0 01/09/2009   TRIG 178* 12/29/2008   TRIG 124 10/22/2007   Lab Results  Component Value Date   CHOLHDL 3 01/09/2009   CHOLHDL 3.4 12/29/2008   CHOLHDL 3.6 CALC 10/22/2007   No results found for this basename: LDLDIRECT      Radiology:  No results found.  EKG:  NSR with no ST changes ASSESSMENT:  1.  Diaphoresis and feeling of flushing similar to presentation a few days ago at which time cath showed occluded RCA with good left to right collateral flow - EKG is nonischemic and initial Troponin normal 2.  ASCAD with prior stents to the RCA/LAD and Ramus - cath a few days ago showed patent stents with minimal restenosis and ostial occlusion of the RCA with excellent left to right collaterals.  Medical management was recommended.   3.  Mild LV dysfunction with EF 45% 4.  HTN 5.  Dyslipidemia 6.  Hypothyroidism  PLAN:   1.  Continue to cycle cardiac enzymes 2.  Continue ASA/statin/Plavix/beta blocker/nitrates. 3.  Will hold on increasing dose of beta blocker due to borderline bradycardia 4.  Add Imdur 30mg  daily   Quintella Reichert, MD  10/23/2012  9:29 PM

## 2012-10-23 NOTE — ED Notes (Signed)
Patient was transferred to 3 west via stretcher, NAD noted denies pain vital signs stable.

## 2012-10-23 NOTE — H&P (Addendum)
[facility] History and Physical  FRANKLIN BAUMBACH NGE:[id number] DOB: [birth year] DOA: 10/23/2012  Referring physician: * PCP: Minda Meo, MD   Chief Complaint: Chest pain  HPI:  79 year old male with a history of coronary artery disease status post stenting of the proximal and the distal RCA in 2009 for a STEMI. The patient recently had symptoms of flushing without significant chest pain. He had a stress Myoview that was intermediate risk EF was 40%. The patient had a cardiac catheterization done on 6/24 that showed single-vessel coronary artery disease. Patient presents today with similar symptoms of sweating and feeling hot all over. The patient is Dr. Lanell Matar patient who has declined to admit the patient.      Review of Systems: negative for the following  Constitutional: Denies fever, chills, diaphoresis, appetite change and fatigue.  HEENT: Denies photophobia, eye pain, redness, hearing loss, ear pain, congestion, sore throat, rhinorrhea, sneezing, mouth sores, trouble swallowing, neck pain, neck stiffness and tinnitus.  Respiratory: Denies SOB, DOE, cough, chest tightness, and wheezing.  Cardiovascular: Denies chest pain, palpitations and leg swelling.  Gastrointestinal: Denies nausea, vomiting, abdominal pain, diarrhea, constipation, blood in stool and abdominal distention.  Genitourinary: Denies dysuria, urgency, frequency, hematuria, flank pain and difficulty urinating.  Musculoskeletal: Denies myalgias, back pain, joint swelling, arthralgias and gait problem.  Skin: Denies pallor, rash and wound.  Neurological: Denies dizziness, seizures, syncope, weakness, light-headedness, numbness and headaches.  Hematological: Denies adenopathy. Easy bruising, personal or family bleeding history  Psychiatric/Behavioral: Denies suicidal ideation, mood changes, confusion, nervousness, sleep disturbance and agitation       Past Medical History  Diagnosis Date  . Coronary  artery disease   . Hypertension   . Hyperlipidemia   . GERD (gastroesophageal reflux disease)   . Hypothyroidism   . Personal history of malignant neoplasm of bladder   . Asthma   . Myocardial infarction     2009 WITH STENTS     Past Surgical History  Procedure Laterality Date  . Coronary stent placement      of the right coronary artery and left anterior descending  . Bladder surgery    . Liver biopsy    . Ankle surgery      left  . Cystourethroscopy    . Tonsillectomy    . Colonoscopy        Social History:  reports that he has never smoked. He has never used smokeless tobacco. He reports that  drinks alcohol. He reports that he does not use illicit drugs.    No Known Allergies  Family History  Problem Relation Age of Onset  . Other      both of his parents were in their mid 32s when they died, neither one had heart disease  . Heart disease Brother     he developed heart disease after the age of 90  . Prostate cancer Father   . Breast cancer Sister   . Colon cancer Neg Hx   . Rectal cancer Neg Hx   . Stomach cancer Neg Hx      Prior to Admission medications   Medication Sig Start Date End Date Taking? Authorizing Provider  aspirin 81 MG tablet Take 81 mg by mouth every evening.    Yes Historical Provider, MD  atorvastatin (LIPITOR) 40 MG tablet Take 40 mg by mouth every evening.   Yes Historical Provider, MD  clopidogrel (PLAVIX) 75 MG tablet Take 75 mg by mouth daily.   Yes Historical Provider, MD  docusate  sodium (COLACE) 100 MG capsule Take 100 mg by mouth daily as needed for constipation.    Yes Historical Provider, MD  hydrocortisone (ANUSOL-HC) 25 MG suppository Place 25 mg rectally 2 (two) times daily.   Yes Historical Provider, MD  levothyroxine (SYNTHROID, LEVOTHROID) 50 MCG tablet Take 50 mcg by mouth daily.     Yes Historical Provider, MD  metoprolol succinate (TOPROL-XL) 25 MG 24 hr tablet Take 12.5 mg by mouth 2 (two) times daily.   Yes Historical  Provider, MD  mometasone (NASONEX) 50 MCG/ACT nasal spray Place 2 sprays into the nose daily as needed (congestion).    Yes Historical Provider, MD  nitroGLYCERIN (NITROSTAT) 0.4 MG SL tablet Place 0.4 mg under the tongue every 5 (five) minutes as needed for chest pain.    Yes Historical Provider, MD  polyethylene glycol (MIRALAX / GLYCOLAX) packet Take 17 g by mouth daily.   Yes Historical Provider, MD  vitamin B-12 (CYANOCOBALAMIN) 1000 MCG tablet Take 500 mcg by mouth daily.    Yes Historical Provider, MD     Physical Exam: Filed Vitals:   10/23/12 1259 10/23/12 1300 10/23/12 1315 10/23/12 1330  BP: 151/79 153/76 153/67 147/75  Pulse: 62 62 60 62  Temp: 97.8 F (36.6 C)     TempSrc: Oral     Resp: 18 14 19 15   SpO2: 100% 98% 97% 95%     Constitutional: Vital signs reviewed. Patient is a well-developed and well-nourished in no acute distress and cooperative with exam. Alert and oriented x3.  Head: Normocephalic and atraumatic  Ear: TM normal bilaterally  Mouth: no erythema or exudates, MMM  Eyes: PERRL, EOMI, conjunctivae normal, No scleral icterus.  Neck: Supple, Trachea midline normal ROM, No JVD, mass, thyromegaly, or carotid bruit present.  Cardiovascular: RRR, S1 normal, S2 normal, no MRG, pulses symmetric and intact bilaterally  Pulmonary/Chest: CTAB, no wheezes, rales, or rhonchi  Abdominal: Soft. Non-tender, non-distended, bowel sounds are normal, no masses, organomegaly, or guarding present.  GU: no CVA tenderness Musculoskeletal: No joint deformities, erythema, or stiffness, ROM full and no nontender Ext: no edema and no cyanosis, pulses palpable bilaterally (DP and PT)  Hematology: no cervical, inginal, or axillary adenopathy.  Neurological: A&O x3, Strenght is normal and symmetric bilaterally, cranial nerve II-XII are grossly intact, no focal motor deficit, sensory intact to light touch bilaterally.  Skin: Warm, dry and intact. No rash, cyanosis, or clubbing.   Psychiatric: Normal mood and affect. speech and behavior is normal. Judgment and thought content normal. Cognition and memory are normal.       Labs on Admission:    Basic Metabolic Panel:  Recent Labs Lab 10/23/12 1046  NA 136  K 4.0  CL 102  CO2 25  GLUCOSE 117*  BUN 18  CREATININE 1.01  CALCIUM 9.1   Liver Function Tests:  Recent Labs Lab 10/23/12 1046  AST 22  ALT 39  ALKPHOS 114  BILITOT 0.5  PROT 6.6  ALBUMIN 3.6   No results found for this basename: LIPASE, AMYLASE,  in the last 168 hours No results found for this basename: AMMONIA,  in the last 168 hours CBC:  Recent Labs Lab 10/23/12 1046  WBC 7.6  NEUTROABS 4.7  HGB 14.7  HCT 41.6  MCV 85.1  PLT 130*   Cardiac Enzymes: No results found for this basename: CKTOTAL, CKMB, CKMBINDEX, TROPONINI,  in the last 168 hours  BNP (last 3 results)  Recent Labs  09/29/12 0645  PROBNP 140.5  CBG: No results found for this basename: GLUCAP,  in the last 168 hours  Radiological Exams on Admission: No results found.  EKG: Independently reviewed.    Assessment/Plan Active Problems:   * No active hospital problems. *  Chest pain Admit to rule out acute coronary syndrome ER discussed with Dr. Mayford Knife who would like hospitalist to admit Cardiology aware Will order TSH Given recent extensive cardiac workup, will admit to telemetry for observation only and cycle cardiac enzymes Called dr Mayford Knife and she stated she will see the patient in the morning   Asthma Will order nebulizer treatments   Hypothyroidism Continue Synthroid check a TSH  Code Status:   full Family Communication: bedside Disposition Plan: admit   Time spent: 70 mins   [facility] Pager [phone]  If 7PM-7AM, please contact night-coverage [email or web address] Password [email or web address] 10/23/2012, 1:45 PM

## 2012-10-24 DIAGNOSIS — I471 Supraventricular tachycardia, unspecified: Secondary | ICD-10-CM | POA: Diagnosis present

## 2012-10-24 DIAGNOSIS — I10 Essential (primary) hypertension: Secondary | ICD-10-CM

## 2012-10-24 DIAGNOSIS — I2510 Atherosclerotic heart disease of native coronary artery without angina pectoris: Secondary | ICD-10-CM

## 2012-10-24 LAB — BASIC METABOLIC PANEL
BUN: 17 mg/dL (ref 6–23)
CO2: 26 mEq/L (ref 19–32)
Calcium: 8.9 mg/dL (ref 8.4–10.5)
Creatinine, Ser: 1 mg/dL (ref 0.50–1.35)
GFR calc non Af Amer: 70 mL/min — ABNORMAL LOW (ref >90)
Glucose, Bld: 103 mg/dL — ABNORMAL HIGH (ref 70–99)
Sodium: 137 mEq/L (ref 135–145)

## 2012-10-24 LAB — CBC
MCH: 29.7 pg (ref 26.0–34.0)
MCHC: 34.8 g/dL (ref 30.0–36.0)
MCV: 85.5 fL (ref 78.0–100.0)
Platelets: 132 10*3/uL — ABNORMAL LOW (ref 150–400)
RDW: 13.7 % (ref 11.5–15.5)

## 2012-10-24 MED ORDER — ISOSORBIDE MONONITRATE ER 30 MG PO TB24: 30.0000 mg | ORAL_TABLET | Freq: Every day | ORAL | Status: DC

## 2012-10-24 MED ORDER — LISINOPRIL 10 MG PO TABS: 10.0000 mg | ORAL_TABLET | Freq: Every day | ORAL | Status: DC

## 2012-10-24 NOTE — Care Management Utilization Note (Signed)
Utilization review completed.  

## 2012-10-24 NOTE — Progress Notes (Signed)
   Primary cardiologist: Dr. Charlton Haws  Subjective:   No recurrent symptoms under observation. No sense of palpitations.   Objective:   Temp:  [97.5 F (36.4 C)-98.1 F (36.7 C)] 97.5 F (36.4 C) (06/29 0500) Pulse Rate:  [53-137] 66 (06/29 0500) Resp:  [10-21] 18 (06/29 0500) BP: (139-169)/(66-88) 154/70 mmHg (06/29 0500) SpO2:  [95 %-100 %] 99 % (06/29 0500) Last BM Date: 10/22/12  There were no vitals filed for this visit.  Intake/Output Summary (Last 24 hours) at 10/24/12 1032 Last data filed at 10/23/12 2100  Gross per 24 hour  Intake    120 ml  Output      0 ml  Net    120 ml   Telemetry: Sinus rhythm with episodes of sinus tachycardia, and possibly change to SVT up to 140 bpm.  Exam:  General: Comfortable at rest.  Lungs: Clear, nonlabored.  Cardiac: RRR, no gallop.  Extremities: No pitting.  Lab Results:  Basic Metabolic Panel:  Recent Labs Lab 10/23/12 1046 10/23/12 1426 10/24/12 0150  NA 136  --  137  K 4.0  --  4.0  CL 102  --  103  CO2 25  --  26  GLUCOSE 117*  --  103*  BUN 18  --  17  CREATININE 1.01 1.00 1.00  CALCIUM 9.1  --  8.9    CBC:  Recent Labs Lab 10/23/12 1046 10/23/12 1426 10/24/12 0150  WBC 7.6 7.5 9.1  HGB 14.7 14.2 14.6  HCT 41.6 41.1 42.0  MCV 85.1 85.3 85.5  PLT 130* 130* 132*    Cardiac Enzymes:  Recent Labs Lab 10/23/12 1545 10/23/12 1940 10/24/12 0150  TROPONINI <0.30 <0.30 <0.30     Medications:   Scheduled Medications: . aspirin EC  81 mg Oral Daily  . atorvastatin  40 mg Oral QPM  . clopidogrel  75 mg Oral Q breakfast  . enoxaparin (LOVENOX) injection  40 mg Subcutaneous Q24H  . isosorbide mononitrate  30 mg Oral Daily  . levothyroxine  50 mcg Oral QAC breakfast  . metoprolol succinate  12.5 mg Oral BID  . polyethylene glycol  17 g Oral Daily  . sodium chloride  3 mL Intravenous Q12H     Infusions: . sodium chloride 75 mL/hr at 10/23/12 1643     PRN  Medications:  acetaminophen, acetaminophen, albuterol, ipratropium, nitroGLYCERIN, ondansetron (ZOFRAN) IV, ondansetron   Assessment:   1. CAD, recent cardiac catheterization 6/24 demonstrating occluded RCA with left to right collaterals. Otherwise patent stents in the LAD and ramus, medical therapy recommended. Has had recent symptoms of diaphoresis. Cardiac markers argue against ACS. Imdur was added.  2. Cardiomyopathy, LVEF approximately 45%.  3. Hypertension.  4. Hyperlipidemia, on statin therapy.   Plan/Discussion:    Discussed with patient and multiple family members in the room. He would like to go home today. Cardiac markers are reassuring. Imdur was added yesterday. Question whether interval tachycardia is symptom provoking, actually looks like he develops SVT at times, although does not specifically sense any palpitations, even on telemetry under observation. Would recommend continue current regimen, will arrange a seven-day cardiac monitor as an outpatient through the office, and will then need a followup visit with Dr. Eden Emms in the next 2 weeks.   Jonelle Sidle, M.D., F.A.C.C.

## 2012-10-24 NOTE — Discharge Summary (Signed)
Physician Discharge Summary  Adrian Jensen QMV:[id number] DOB: 25-Jul-1932 DOA: 10/23/2012  PCP: Minda Meo, MD  Admit date: 10/23/2012 Discharge date: 10/24/2012  Time spent: 45 minutes  Recommendations for Outpatient Follow-up:  1. PSVT; currently patient asymptomatic. Per cardiology recommendation will continue current regimen, will arrange a seven-day cardiac monitor as an outpatient through the office, and will then need a followup visit with Dr. Eden Emms in the next 2 weeks 2. Hyperlipidemia; continue patient's 40 mg of Lipitor. PCP to monitor efficacy. 3. Hypertension;patient with extensive history of cardiovascular disease, with 6 stents placed 2005 not within AHA guidelines for BP control will add lisinopril 10 mg daily to outpatient regimen.  Discharge Diagnoses:  Active Problems:   HYPOTHYROIDISM   HYPERLIPIDEMIA   HYPERTENSION   PSVT (paroxysmal supraventricular tachycardia)   Discharge Condition: stable Diet recommendation:heart healthy  There were no vitals filed for this visit.  History of present illness:  80 yo WM PMHx CAD w/ 6 x stenting of the proximal and the distal RCA in 2009 for a STEMI. Per admission note Pt presented with SSx  flushing w/o significant chest pain. He had a stress Myoview that was intermediate risk EF was 40%. The patient had a cardiac catheterization done on cath 10/19/2012 showing 20% LM, 30% prox LAD in prox stent, 20% in prox portion of stent in Ramus, 30% stenosis . Patient presents today with similar symptoms of sweating and feeling hot all over. The patient is Adrian Jensen patient who has declined to admit the patient.   Hospital Course:  Patient was about patient. Initially admitted on 24 June where he had a catheterization which showed 20% LM, 30% proximal LAD and proximal stent, 20% in proximal portion of stent in the ramus, 30% stenosis. Presented with chest pain. Admitted for observation; per cardiology recommendations patient to  continue aspirin/statin/Plavix/beta blocker/nitrate. M. Imdur 30 mg daily added  Procedures:   none Consultations:  cardiology  Discharge Exam: Filed Vitals:   10/23/12 1445 10/23/12 1540 10/23/12 2100 10/24/12 0500  BP: 169/78 145/78 154/88 154/70  Pulse: 65 61 62 66  Temp:  97.7 F (36.5 C) 98.1 F (36.7 C) 97.5 F (36.4 C)  TempSrc:      Resp: 15 16 18 18   SpO2: 95% 97% 98% 99%    General: alert, NAD Cardiovascular:regular rhythm and rate, negative murmurs rubs or gallops, DP/PT pulses +2 Respiratory:clear to ossification bilaterally  Discharge Instructions       Future Appointments Provider Department Dept Phone   11/09/2012 9:50 AM Beatrice Lecher, PA-C [facility] Main Office [place]) [phone]       Medication List         aspirin 81 MG tablet  Take 81 mg by mouth every evening.     atorvastatin 40 MG tablet  Commonly known as:  LIPITOR  Take 40 mg by mouth every evening.     clopidogrel 75 MG tablet  Commonly known as:  PLAVIX  Take 75 mg by mouth daily.     docusate sodium 100 MG capsule  Commonly known as:  COLACE  Take 100 mg by mouth daily as needed for constipation.     hydrocortisone 25 MG suppository  Commonly known as:  ANUSOL-HC  Place 25 mg rectally 2 (two) times daily.     levothyroxine 50 MCG tablet  Commonly known as:  SYNTHROID, LEVOTHROID  Take 50 mcg by mouth daily.     metoprolol succinate 25 MG 24 hr tablet  Commonly known as:  TOPROL-XL  Take 12.5 mg by mouth 2 (two) times daily.     mometasone 50 MCG/ACT nasal spray  Commonly known as:  NASONEX  Place 2 sprays into the nose daily as needed (congestion).     nitroGLYCERIN 0.4 MG SL tablet  Commonly known as:  NITROSTAT  Place 0.4 mg under the tongue every 5 (five) minutes as needed for chest pain.     polyethylene glycol packet  Commonly known as:  MIRALAX / GLYCOLAX  Take 17 g by mouth daily.     vitamin B-12 1000 MCG tablet  Commonly known as:   CYANOCOBALAMIN  Take 500 mcg by mouth daily.       No Known Allergies Follow-up Information   Follow up with Minda Meo, MD.   Contact information:   [address] [facility] [place] [address] [phone]       Follow up with Charlton Haws, MD. (patient requires continuous heart monitor, rule out PSVT)    Contact information:   [address] Jaclyn Prime [place] [address] [phone]        The results of significant diagnostics from this hospitalization (including imaging, microbiology, ancillary and laboratory) are listed below for reference.    Significant Diagnostic Studies: Dg Chest 2 View  09/29/2012   *RADIOLOGY REPORT*  Clinical Data: Chest pain  CHEST - 2 VIEW  Comparison: 12/28/2008  Findings: Cardiomediastinal silhouette is stable.  Mild upper thoracic dextroscoliosis.  Small hiatal hernia with air-fluid level.  No acute infiltrate or pleural effusion.  No pulmonary edema.  Mild degenerative changes thoracic spine.  IMPRESSION: Small hiatal hernia with air-fluid level.  No active disease.   Original Report Authenticated By: Natasha Mead, M.D.    Microbiology: No results found for this or any previous visit (from the past 240 hour(s)).   Labs: Basic Metabolic Panel:  Recent Labs Lab 10/23/12 1046 10/23/12 1426 10/24/12 0150  NA 136  --  137  K 4.0  --  4.0  CL 102  --  103  CO2 25  --  26  GLUCOSE 117*  --  103*  BUN 18  --  17  CREATININE 1.01 1.00 1.00  CALCIUM 9.1  --  8.9   Liver Function Tests:  Recent Labs Lab 10/23/12 1046  AST 22  ALT 39  ALKPHOS 114  BILITOT 0.5  PROT 6.6  ALBUMIN 3.6   No results found for this basename: LIPASE, AMYLASE,  in the last 168 hours No results found for this basename: AMMONIA,  in the last 168 hours CBC:  Recent Labs Lab 10/23/12 1046 10/23/12 1426 10/24/12 0150  WBC 7.6 7.5 9.1  NEUTROABS 4.7  --   --   HGB 14.7 14.2 14.6  HCT 41.6  41.1 42.0  MCV 85.1 85.3 85.5  PLT 130* 130* 132*   Cardiac Enzymes:  Recent Labs Lab 10/23/12 1545 10/23/12 1940 10/24/12 0150  TROPONINI <0.30 <0.30 <0.30   BNP: BNP (last 3 results)  Recent Labs  09/29/12 0645  PROBNP 140.5   CBG: No results found for this basename: GLUCAP,  in the last 168 hours     Signed:  Carolyne Littles, J  [facility] 10/24/2012, 1:13 PM

## 2012-10-25 ENCOUNTER — Telehealth: Payer: Self-pay | Admitting: Cardiovascular Disease

## 2012-10-25 ENCOUNTER — Other Ambulatory Visit: Payer: Self-pay | Admitting: *Deleted

## 2012-10-25 DIAGNOSIS — R000 Tachycardia, unspecified: Secondary | ICD-10-CM

## 2012-10-25 DIAGNOSIS — I471 Supraventricular tachycardia: Secondary | ICD-10-CM

## 2012-10-25 NOTE — Telephone Encounter (Signed)
New Problem  Wife wants to know how low is too low before she should be concerned about his BP. She said it was 94/55 hr 64.

## 2012-10-25 NOTE — Telephone Encounter (Signed)
New Problem:    Patient's wife called in because she feels like if her husband had seen a cardiologist he would have never ended up in the hospital this past weekend.  She would like her husband to see Dr. Eden Emms as soon as possible and she does not want him to see a PA.  Please call back.

## 2012-10-25 NOTE — Telephone Encounter (Signed)
SPOKE  WITH PT'S WIFE, HAD SEVERAL CONCERNS RE HUSBAND , AFTER CATH  PT WAS SUPPOSE TO START   IMDUR PER WIFE MED NEVER CALLED IN .  PT WENT TO ER OVER  WEEKEND EXPERIENCING   DIAPHORESIS  AND HURTING    IMDUR AT THAT TIME WAS STARTED  AND NEW SCRIPT WAS SENT TO PHARMACY  UPON  DISCHARGE .  NOW PT EXPERIENCING  LOW B/P   SEE PREVIOUS  READING IN  MESSAGE   ALSO PT EXPERIENCED   FATIGUE AND DIZZINESS   B/P WAS RECHECKED   AND WAS  126/63  INSTRUCTED TO  CONTINUE TO MONITOR B/P  IF  CONSISTENTLY RUNS LOW TO CALL OFFICE  WITH UPDATE  HAS  FOLLOW UP  ON  11-09-12 AT 9:50 WITH SCOTT WEAVER PAC .WILL FORWARD TO DR Eden Emms FOR REVIEW .Zack Seal

## 2012-10-26 NOTE — Telephone Encounter (Signed)
PT'S WIFE AWARE OF MED CHANGE  PER DR NISHAN./CY

## 2012-10-26 NOTE — Telephone Encounter (Signed)
Decrease lisinopril to 5 mg and continue beta blocker and imdur.  Coronaries showed patent left sided stents and good collaterals to RCA so nothing needs to be fixed at this time

## 2012-10-27 NOTE — Telephone Encounter (Signed)
Spoke with Dr. Diona Browner on 10-26-12 and this is the staff message he sent me.  Patient of Dr. Eden Emms seen over the weekend at  [facility] and discharged. He was noted to have episodic sinus tachycardia and possibly even SVT during his brief monitoring. Not clearly symptomatic at that time, however question raised as to whether his presentation with intermittent chest pain symptoms may be associated with more prolonged episodes of SVT provoking angina with known CAD. Since he is not having symptoms on a daily or even every other day basis, recommend a 7 day event monitor.

## 2012-10-28 ENCOUNTER — Encounter: Payer: Self-pay | Admitting: *Deleted

## 2012-10-28 DIAGNOSIS — R000 Tachycardia, unspecified: Secondary | ICD-10-CM

## 2012-10-28 NOTE — Progress Notes (Signed)
Patient ID: Adrian Jensen, male   DOB: [birth year], 79 y.o.   MRN: [id number] E-Cardio verite 7 day event monitor placed on patient.

## 2012-11-04 NOTE — Progress Notes (Signed)
Monitor appointment 10/28/12 at 2 pm.

## 2012-11-09 ENCOUNTER — Ambulatory Visit (INDEPENDENT_AMBULATORY_CARE_PROVIDER_SITE_OTHER): Payer: Medicare Other | Admitting: Cardiovascular Disease

## 2012-11-09 ENCOUNTER — Encounter: Payer: Self-pay | Admitting: Cardiovascular Disease

## 2012-11-09 ENCOUNTER — Ambulatory Visit: Payer: Medicare Other | Admitting: Physician Assistant

## 2012-11-09 VITALS — BP 110/65 | HR 72 | Ht 70.0 in | Wt 186.0 lb

## 2012-11-09 DIAGNOSIS — E785 Hyperlipidemia, unspecified: Secondary | ICD-10-CM

## 2012-11-09 DIAGNOSIS — I10 Essential (primary) hypertension: Secondary | ICD-10-CM

## 2012-11-09 DIAGNOSIS — I471 Supraventricular tachycardia: Secondary | ICD-10-CM

## 2012-11-09 DIAGNOSIS — I2510 Atherosclerotic heart disease of native coronary artery without angina pectoris: Secondary | ICD-10-CM

## 2012-11-09 MED ORDER — LISINOPRIL 5 MG PO TABS: 5.0000 mg | ORAL_TABLET | Freq: Every day | ORAL | Status: AC

## 2012-11-09 NOTE — Assessment & Plan Note (Signed)
Vs flutter Continue beta blocker Can consider amiodarone in future if he has frequent break through.  Event monitor reviewed today

## 2012-11-09 NOTE — Patient Instructions (Addendum)
Your physician recommends that you schedule a follow-up appointment in:  3 MONTHS WITH  DR NISHAN  Your physician recommends that you continue on your current medications as directed. Please refer to the Current Medication list given to you today.  

## 2012-11-09 NOTE — Progress Notes (Signed)
Patient ID: Adrian Jensen, male   DOB: 1932-12-18, 79 y.o.   MRN: [id number] 79 yo with CAD  Recent cath Dr [place] with patent stents to ramus and LAD total RCA with good collaterals  Has had intermitant PSVT vs flutter. Reviewed event monitor and appears more like flutter. Improved with beta blocker. BP labile at home No chest pain.  Discussed issues of antiarrhythmic vs continued beta blockade and prefer not to start amiodarone at this time.  Lisinopril recently decreased to 5 mg due to periods of low BP  ROS: Denies fever, malais, weight loss, blurry vision, decreased visual acuity, cough, sputum, SOB, hemoptysis, pleuritic pain, palpitaitons, heartburn, abdominal pain, melena, lower extremity edema, claudication, or rash.  All other systems reviewed and negative  General: Affect appropriate Healthy:  appears stated age HEENT: normal Neck supple with no adenopathy JVP normal no bruits no thyromegaly Lungs clear with no wheezing and good diaphragmatic motion Heart:  S1/S2 no murmur, no rub, gallop or click PMI normal Abdomen: benighn, BS positve, no tenderness, no AAA no bruit.  No HSM or HJR Distal pulses intact with no bruits No edema Neuro non-focal Skin warm and dry No muscular weakness   Current Outpatient Prescriptions  Medication Sig Dispense Refill  . aspirin 81 MG tablet Take 81 mg by mouth every evening.       Marland Kitchen atorvastatin (LIPITOR) 40 MG tablet Take 40 mg by mouth every evening.      . clopidogrel (PLAVIX) 75 MG tablet Take 75 mg by mouth daily.      Marland Kitchen docusate sodium (COLACE) 100 MG capsule Take 100 mg by mouth daily as needed for constipation.       . isosorbide mononitrate (IMDUR) 30 MG 24 hr tablet Take 1 tablet (30 mg total) by mouth daily.  90 tablet  3  . levothyroxine (SYNTHROID, LEVOTHROID) 50 MCG tablet Take 50 mcg by mouth daily.        Marland Kitchen lisinopril (PRINIVIL,ZESTRIL) 5 MG tablet Take 1 tablet (5 mg total) by mouth daily.  30 tablet  11  . metoprolol succinate  (TOPROL-XL) 25 MG 24 hr tablet Take 12.5 mg by mouth 2 (two) times daily.      . mometasone (NASONEX) 50 MCG/ACT nasal spray Place 2 sprays into the nose daily as needed (congestion).       . nitroGLYCERIN (NITROSTAT) 0.4 MG SL tablet Place 0.4 mg under the tongue every 5 (five) minutes as needed for chest pain.       . polyethylene glycol (MIRALAX / GLYCOLAX) packet Take 17 g by mouth daily.      . vitamin B-12 (CYANOCOBALAMIN) 1000 MCG tablet Take 500 mcg by mouth daily.        No current facility-administered medications for this visit.    Allergies  Review of patient's allergies indicates no known allergies.  Electrocardiogram:  10/24/25 SR rate 67 normal   Assessment and Plan

## 2012-11-09 NOTE — Assessment & Plan Note (Signed)
Cholesterol is at goal.  Continue current dose of statin and diet Rx.  No myalgias or side effects.  F/U  LFT's in 6 months. Lab Results  Component Value Date   LDLCALC 82 01/09/2009

## 2012-11-09 NOTE — Assessment & Plan Note (Signed)
Stable with no angina and good activity level.  Continue medical Rx  

## 2012-11-09 NOTE — Assessment & Plan Note (Signed)
Labile Has home Om[employer] machine Will bring in to calibrate Lisinoopril decreased

## 2012-11-16 ENCOUNTER — Other Ambulatory Visit: Payer: Self-pay | Admitting: Cardiology

## 2012-11-30 ENCOUNTER — Other Ambulatory Visit: Payer: Self-pay | Admitting: Cardiology

## 2013-02-07 ENCOUNTER — Ambulatory Visit (INDEPENDENT_AMBULATORY_CARE_PROVIDER_SITE_OTHER): Payer: Medicare Other | Admitting: Cardiovascular Disease

## 2013-02-07 VITALS — BP 120/80 | HR 78 | Wt 187.0 lb

## 2013-02-07 DIAGNOSIS — E785 Hyperlipidemia, unspecified: Secondary | ICD-10-CM

## 2013-02-07 DIAGNOSIS — I471 Supraventricular tachycardia: Secondary | ICD-10-CM

## 2013-02-07 DIAGNOSIS — I2510 Atherosclerotic heart disease of native coronary artery without angina pectoris: Secondary | ICD-10-CM

## 2013-02-07 DIAGNOSIS — I10 Essential (primary) hypertension: Secondary | ICD-10-CM

## 2013-02-07 NOTE — Patient Instructions (Signed)
Your physician wants you to follow-up in:  6 MONTHS WITH DR NISHAN  You will receive a reminder letter in the mail two months in advance. If you don't receive a letter, please call our office to schedule the follow-up appointment. Your physician recommends that you continue on your current medications as directed. Please refer to the Current Medication list given to you today. 

## 2013-02-07 NOTE — Assessment & Plan Note (Signed)
Stable with no angina and good activity level.  Continue medical Rx  

## 2013-02-07 NOTE — Progress Notes (Signed)
Patient ID: Adrian Jensen, male   DOB: October 28, 1932, 79 y.o.   MRN: [id number] 79 yo with CAD Recent cath 6.24.14   Dr [place] with patent stents to ramus and LAD total RCA with good collaterals Has had intermitant PSVT vs flutter. Reviewed event monitor and appears more like flutter. Improved with beta blocker. BP labile at home No chest pain. Discussed issues of antiarrhythmic vs continued beta blockade and prefer not to start amiodarone at this time. Lisinopril recently decreased to 5 mg due to periods of low BP      ROS: Denies fever, malais, weight loss, blurry vision, decreased visual acuity, cough, sputum, SOB, hemoptysis, pleuritic pain, palpitaitons, heartburn, abdominal pain, melena, lower extremity edema, claudication, or rash.  All other systems reviewed and negative  General: Affect appropriate Healthy:  appears stated age HEENT: normal Neck supple with no adenopathy JVP normal no bruits no thyromegaly Lungs clear with no wheezing and good diaphragmatic motion Heart:  S1/S2 no murmur, no rub, gallop or click PMI normal Abdomen: benighn, BS positve, no tenderness, no AAA no bruit.  No HSM or HJR Distal pulses intact with no bruits No edema Neuro non-focal Skin warm and dry RUE atrophy from polio  Current Outpatient Prescriptions  Medication Sig Dispense Refill  . aspirin 81 MG tablet Take 81 mg by mouth every evening.       Marland Kitchen atorvastatin (LIPITOR) 40 MG tablet Take 40 mg by mouth every evening.      . clopidogrel (PLAVIX) 75 MG tablet TAKE 1 TABLET EACH DAY.  30 tablet  10  . docusate sodium (COLACE) 100 MG capsule Take 100 mg by mouth daily as needed for constipation.       . isosorbide mononitrate (IMDUR) 30 MG 24 hr tablet Take 1 tablet (30 mg total) by mouth daily.  90 tablet  3  . levothyroxine (SYNTHROID, LEVOTHROID) 50 MCG tablet Take 50 mcg by mouth daily.        Marland Kitchen lisinopril (PRINIVIL,ZESTRIL) 5 MG tablet Take 1 tablet (5 mg total) by mouth daily.  30 tablet  11   . metoprolol succinate (TOPROL-XL) 25 MG 24 hr tablet TAKE (1/2) TABLET TWICE DAILY.  30 tablet  6  . mometasone (NASONEX) 50 MCG/ACT nasal spray Place 2 sprays into the nose daily as needed (congestion).       . nitroGLYCERIN (NITROSTAT) 0.4 MG SL tablet Place 0.4 mg under the tongue every 5 (five) minutes as needed for chest pain.       . polyethylene glycol (MIRALAX / GLYCOLAX) packet Take 17 g by mouth daily.      . vitamin B-12 (CYANOCOBALAMIN) 1000 MCG tablet Take 500 mcg by mouth daily.        No current facility-administered medications for this visit.    Allergies  Review of patient's allergies indicates no known allergies.  Electrocardiogram:  6/29  SR rate 69 normal ECG   Assessment and Plan

## 2013-02-07 NOTE — Assessment & Plan Note (Signed)
Well controlled.  Continue current medications and low sodium Dash type diet.    

## 2013-02-07 NOTE — Assessment & Plan Note (Signed)
Quiescent on beta blockers

## 2013-02-07 NOTE — Assessment & Plan Note (Signed)
Cholesterol is at goal.  Continue current dose of statin and diet Rx.  No myalgias or side effects.  F/U  LFT's in 6 months. Lab Results  Component Value Date   LDLCALC 82 01/09/2009

## 2013-06-14 ENCOUNTER — Other Ambulatory Visit: Payer: Self-pay

## 2013-06-14 MED ORDER — METOPROLOL SUCCINATE ER 25 MG PO TB24: ORAL_TABLET | ORAL | Status: DC

## 2013-07-15 ENCOUNTER — Ambulatory Visit (INDEPENDENT_AMBULATORY_CARE_PROVIDER_SITE_OTHER): Payer: Medicare Other | Admitting: Cardiology

## 2013-07-15 ENCOUNTER — Encounter: Payer: Self-pay | Admitting: Cardiology

## 2013-07-15 VITALS — BP 124/74 | HR 71 | Ht 70.0 in | Wt 186.0 lb

## 2013-07-15 DIAGNOSIS — R55 Syncope and collapse: Secondary | ICD-10-CM

## 2013-07-15 DIAGNOSIS — I10 Essential (primary) hypertension: Secondary | ICD-10-CM

## 2013-07-15 DIAGNOSIS — I2510 Atherosclerotic heart disease of native coronary artery without angina pectoris: Secondary | ICD-10-CM

## 2013-07-15 DIAGNOSIS — E785 Hyperlipidemia, unspecified: Secondary | ICD-10-CM

## 2013-07-15 NOTE — Patient Instructions (Signed)
Continue your current therapy  Follow up with Dr. Johnsie Cancel as scheduled.

## 2013-07-16 NOTE — Progress Notes (Signed)
Adrian Jensen Date of Birth: 04/10/1933 Medical Record #[id number]  History of Present Illness: Mr. Adrian Jensen is seen today as a work in. He is a 80 yo WM with known history of CAD. He has prior stenting of the LAD, ramus intermediate, and proximal/mid/distal RCA. Cardiac cath in June 2014 showed the RCA was occluded at the ostium with excellent left to right collaterals. The left coronary had no significant disease. He has been managed medically without anginal symptoms. He also has a history of PSVT vs flutter. This am he got up to use the bathroom and then went back to bed. He became acutely diaphoretic breaking out in a cold sweat. This lasted about 15 minutes then resolved. He felt weak for about an hour. He was concerned because he had similar sweating with a heart attack in the past. He denies any chest pain with this episode or dyspnea. No palpitations. He states he has had a head cold all week. He does have regular bouts of indigestion that are relieved with Prilosec or Pepcid AC.   Current Outpatient Prescriptions on File Prior to Visit  Medication Sig Dispense Refill  . aspirin 81 MG tablet Take 81 mg by mouth every evening.       Marland Kitchen atorvastatin (LIPITOR) 40 MG tablet Take 40 mg by mouth every evening.      . clopidogrel (PLAVIX) 75 MG tablet TAKE 1 TABLET EACH DAY.  30 tablet  10  . docusate sodium (COLACE) 100 MG capsule Take 100 mg by mouth daily as needed for constipation.       . isosorbide mononitrate (IMDUR) 30 MG 24 hr tablet Take 1 tablet (30 mg total) by mouth daily.  90 tablet  3  . levothyroxine (SYNTHROID, LEVOTHROID) 50 MCG tablet Take 50 mcg by mouth daily.        Marland Kitchen lisinopril (PRINIVIL,ZESTRIL) 5 MG tablet Take 1 tablet (5 mg total) by mouth daily.  30 tablet  11  . metoprolol succinate (TOPROL-XL) 25 MG 24 hr tablet TAKE (1/2) TABLET TWICE DAILY.  30 tablet  6  . mometasone (NASONEX) 50 MCG/ACT nasal spray Place 2 sprays into the nose daily as needed (congestion).         . nitroGLYCERIN (NITROSTAT) 0.4 MG SL tablet Place 0.4 mg under the tongue every 5 (five) minutes as needed for chest pain.       . vitamin B-12 (CYANOCOBALAMIN) 1000 MCG tablet Take 500 mcg by mouth daily.        No current facility-administered medications on file prior to visit.    No Known Allergies  Past Medical History  Diagnosis Date  . Coronary artery disease   . Hypertension   . Hyperlipidemia   . GERD (gastroesophageal reflux disease)   . Hypothyroidism   . Personal history of malignant neoplasm of bladder   . Asthma   . Myocardial infarction     2009 WITH STENTS    Past Surgical History  Procedure Laterality Date  . Coronary stent placement      of the right coronary artery and left anterior descending  . Bladder surgery    . Liver biopsy    . Ankle surgery      left  . Cystourethroscopy    . Tonsillectomy    . Colonoscopy      History  Smoking status  . Never Smoker   Smokeless tobacco  . Never Used    History  Alcohol Use  . Yes  Comment: seldom    Family History  Problem Relation Age of Onset  . Other      both of his parents were in their mid 55s when they died, neither one had heart disease  . Heart disease Brother     he developed heart disease after the age of 51  . Prostate cancer Father   . Breast cancer Sister   . Colon cancer Neg Hx   . Rectal cancer Neg Hx   . Stomach cancer Neg Hx     Review of Systems: As noted in HPI.  All other systems were reviewed and are negative.  Physical Exam: BP 124/74  Pulse 71  Ht 5\' 10"  (1.778 m)  Wt 186 lb (84.369 kg)  BMI 26.69 kg/m2 He is a WDWM in NAD.  HEENT: [place]/AT, PERRL, EOMI. Sclera are clear. Oropharynx is clear. Neck: no JVD, adenopathy, thyromegaly, or bruits. Lungs: clear. CV: RRR, normal S1-2. No gallop or murmur. Abd: soft, NT. BS +. No HSM or masses. Ext: no edema. Pulses are 2+ Skin: warm and dry. Neuro: alert and oriented x 3. Nonfocal.  LABORATORY DATA:  Ecg:  NSR with Occ. PVC. Otherwise normal.  Assessment / Plan: 1. Diaphoresis. I think he had a vasovagal episode. No evidence of acute ischemia and no anginal symptoms on appropriate medication.VSS. Symptoms have completely resolved.  I have reassured him. Symptoms may be related to recent illness. Will continue current therapy and keep scheduled follow up with Dr. Johnsie Jensen next month. He is instructed to call if his symptoms recur.   2. CAD with known CTO of RCA with collaterals. Continue medical management.   3. HTN. Controlled.

## 2013-08-10 ENCOUNTER — Ambulatory Visit: Payer: Medicare Other | Admitting: Cardiovascular Disease

## 2013-08-15 ENCOUNTER — Encounter: Payer: Self-pay | Admitting: Cardiovascular Disease

## 2013-08-15 ENCOUNTER — Ambulatory Visit (INDEPENDENT_AMBULATORY_CARE_PROVIDER_SITE_OTHER): Payer: Medicare Other | Admitting: Cardiovascular Disease

## 2013-08-15 VITALS — BP 122/68 | HR 62 | Ht 69.0 in | Wt 184.0 lb

## 2013-08-15 DIAGNOSIS — E785 Hyperlipidemia, unspecified: Secondary | ICD-10-CM

## 2013-08-15 DIAGNOSIS — I2510 Atherosclerotic heart disease of native coronary artery without angina pectoris: Secondary | ICD-10-CM

## 2013-08-15 DIAGNOSIS — I471 Supraventricular tachycardia, unspecified: Secondary | ICD-10-CM

## 2013-08-15 DIAGNOSIS — I10 Essential (primary) hypertension: Secondary | ICD-10-CM

## 2013-08-15 NOTE — Assessment & Plan Note (Signed)
Stable with no angina and good activity level.  Continue medical Rx  

## 2013-08-15 NOTE — Assessment & Plan Note (Signed)
Cholesterol is at goal.  Continue current dose of statin and diet Rx.  No myalgias or side effects.  F/U  LFT's in 6 months. Lab Results  Component Value Date   LDLCALC 82 01/09/2009   Labs followed by Dr Reynaldo Minium

## 2013-08-15 NOTE — Assessment & Plan Note (Signed)
Well controlled.  Continue current medications and low sodium Dash type diet.    

## 2013-08-15 NOTE — Assessment & Plan Note (Signed)
Quiescent on beta blocker with no palpitations

## 2013-08-15 NOTE — Progress Notes (Signed)
Patient ID: Adrian Jensen, male   DOB: 12-Aug-1932, 80 y.o.   MRN: [id number] 80 yo with CAD Recent cath 6.24.14 Dr [place] with patent stents to ramus and LAD total RCA with good collaterals Has had intermitant PSVT vs flutter. Reviewed event monitor and appears more like flutter. Improved with beta blocker. BP labile at home No chest pain. Discussed issues of antiarrhythmic vs continued beta blockade and prefer not to start amiodarone at this time. Lisinopril recently decreased to 5 mg due to periods of low BP      ROS: Denies fever, malais, weight loss, blurry vision, decreased visual acuity, cough, sputum, SOB, hemoptysis, pleuritic pain, palpitaitons, heartburn, abdominal pain, melena, lower extremity edema, claudication, or rash.  All other systems reviewed and negative  General: Affect appropriate Healthy:  appears stated age 80: normal Neck supple with no adenopathy JVP normal no bruits no thyromegaly Lungs clear with no wheezing and good diaphragmatic motion Heart:  S1/S2 no murmur, no rub, gallop or click PMI normal Abdomen: benighn, BS positve, no tenderness, no AAA no bruit.  No HSM or HJR Distal pulses intact with no bruits No edema Neuro non-focal Skin warm and dry No muscular weakness   Current Outpatient Prescriptions  Medication Sig Dispense Refill  . aspirin 81 MG tablet Take 81 mg by mouth every evening.       Marland Kitchen atorvastatin (LIPITOR) 40 MG tablet Take 40 mg by mouth every evening.      Marland Kitchen azithromycin (ZITHROMAX) 500 MG tablet Take 500 mg by mouth daily.      . clopidogrel (PLAVIX) 75 MG tablet TAKE 1 TABLET EACH DAY.  30 tablet  10  . docusate sodium (COLACE) 100 MG capsule Take 100 mg by mouth daily as needed for constipation.       . fluticasone (VERAMYST) 27.5 MCG/SPRAY nasal spray Place 2 sprays into the nose daily.      . hydrocortisone (ANUSOL-HC) 25 MG suppository Place 25 mg rectally as directed.      . isosorbide mononitrate (IMDUR) 30 MG 24 hr tablet  Take 1 tablet (30 mg total) by mouth daily.  90 tablet  3  . levothyroxine (SYNTHROID, LEVOTHROID) 50 MCG tablet Take 50 mcg by mouth daily.        Marland Kitchen lisinopril (PRINIVIL,ZESTRIL) 5 MG tablet Take 1 tablet (5 mg total) by mouth daily.  30 tablet  11  . metoprolol succinate (TOPROL-XL) 25 MG 24 hr tablet TAKE (1/2) TABLET TWICE DAILY.  30 tablet  6  . nitroGLYCERIN (NITROSTAT) 0.4 MG SL tablet Place 0.4 mg under the tongue every 5 (five) minutes as needed for chest pain.       . promethazine (PHENERGAN) 25 MG tablet Take 25 mg by mouth every 6 (six) hours as needed for nausea or vomiting.      . vitamin B-12 (CYANOCOBALAMIN) 1000 MCG tablet Take 500 mcg by mouth daily.        No current facility-administered medications for this visit.    Allergies  Review of patient's allergies indicates no known allergies.  Electrocardiogram:  SR rate 71 PAC otherwise normal   Assessment and Plan

## 2013-10-18 ENCOUNTER — Other Ambulatory Visit: Payer: Self-pay | Admitting: *Deleted

## 2013-10-18 MED ORDER — ISOSORBIDE MONONITRATE ER 30 MG PO TB24: 30.0000 mg | ORAL_TABLET | Freq: Every day | ORAL | Status: DC

## 2013-11-01 ENCOUNTER — Other Ambulatory Visit: Payer: Self-pay | Admitting: Cardiology

## 2013-12-16 ENCOUNTER — Telehealth: Payer: Self-pay | Admitting: Cardiovascular Disease

## 2013-12-16 NOTE — Telephone Encounter (Signed)
New message     Pt got too hot yesterday---wife says his blood pressure got up to 163/112.  Today he still does not feel well.  Please advise

## 2013-12-19 ENCOUNTER — Other Ambulatory Visit: Payer: Self-pay | Admitting: *Deleted

## 2013-12-19 MED ORDER — NITROGLYCERIN 0.4 MG SL SUBL: 0.4000 mg | SUBLINGUAL_TABLET | SUBLINGUAL | Status: AC | PRN

## 2013-12-19 NOTE — Telephone Encounter (Signed)
SPOKE WITH  PT'S  WIFE  IS  FEELING BETTER  TODAY  HAS  HAD  A  COUPLE OF  EPISODES  OF  SWEATING   ,WEAKNESS, AND  CHEST  FEELING  FUNNY    B/P  HAS  BEEN  AS  HIGH AS   163/112  AND  AS LOW  AS  98/56   HAS NOT  TRIED  NTG   ENCOURAGED  TO TAKE  AS NEEDED  AND IF  TAKES  UP TO  THREE  IN  15 MIN  WITH NO  RELIEF TO  CALL 911  AND GET  TO ER . APPT   MADE  WITH   MICHELE LENZE PA  FOR   12-26-13  AT  12:35 PM  PT'S  WIFE AGREES WITH PLAN .Adonis Housekeeper

## 2013-12-20 ENCOUNTER — Ambulatory Visit (INDEPENDENT_AMBULATORY_CARE_PROVIDER_SITE_OTHER): Payer: Medicare Other | Admitting: Nurse Practitioner

## 2013-12-20 ENCOUNTER — Encounter: Payer: Self-pay | Admitting: Nurse Practitioner

## 2013-12-20 VITALS — BP 138/72 | HR 63 | Ht 69.0 in | Wt 184.4 lb

## 2013-12-20 DIAGNOSIS — E785 Hyperlipidemia, unspecified: Secondary | ICD-10-CM

## 2013-12-20 DIAGNOSIS — I10 Essential (primary) hypertension: Secondary | ICD-10-CM

## 2013-12-20 DIAGNOSIS — R079 Chest pain, unspecified: Secondary | ICD-10-CM

## 2013-12-20 DIAGNOSIS — I2510 Atherosclerotic heart disease of native coronary artery without angina pectoris: Secondary | ICD-10-CM

## 2013-12-20 LAB — TROPONIN I: Troponin I: <0.3 ng/mL (ref <0.06)

## 2013-12-20 NOTE — Progress Notes (Signed)
Dominica Severin Date of Birth: 1932-09-04 Medical Record #[id number]  History of Present Illness: Mr. Kaeser is seen back today for a work in visit. Seen for Dr. Johnsie Cancel. He is 80 years of age. Has known CAD with past stents to the ramus and LAD with a total RCA with good collaterals. Has had intermittant PSVT vs flutter - felt to be more flutter per past monitor. Othre issues include HTN, HLD, hypothyroidism and asthma. Had polio with right arm affected when he was a teenager.   Seen here back in April - felt to be doing ok. ACE had been cut back due to lower BP's.  Comes in today. Here alone. He says he is doing ok now. Had a spell this past Thursday. Was out in the hottest part of the day and was doing yard work. Felt "not quite right". No actual chest pain. Went inside and his wife encouraged him to drink water. He was better by that evening but really laid around all weekend. This was nothing like prior chest pain syndrome. No palpitations. Not short of breath. BP was running higher last week but now improved and stable. Has a sinus headache. Exercising fairly regularly and walked on his treadmill this past weekend and felt ok.   Current Outpatient Prescriptions  Medication Sig Dispense Refill  . aspirin 81 MG tablet Take 81 mg by mouth every evening.       Marland Kitchen atorvastatin (LIPITOR) 40 MG tablet Take 40 mg by mouth every evening.      . clopidogrel (PLAVIX) 75 MG tablet TAKE 1 TABLET EACH DAY.  30 tablet  5  . isosorbide mononitrate (IMDUR) 30 MG 24 hr tablet Take 1 tablet (30 mg total) by mouth daily.  90 tablet  1  . levothyroxine (SYNTHROID, LEVOTHROID) 50 MCG tablet Take 50 mcg by mouth daily.        Marland Kitchen lisinopril (PRINIVIL,ZESTRIL) 5 MG tablet Take 1 tablet (5 mg total) by mouth daily.  30 tablet  11  . metoprolol succinate (TOPROL-XL) 25 MG 24 hr tablet TAKE (1/2) TABLET TWICE DAILY.  30 tablet  6  . nitroGLYCERIN (NITROSTAT) 0.4 MG SL tablet Place 1 tablet (0.4 mg total) under the  tongue every 5 (five) minutes as needed for chest pain.  25 tablet  4  . promethazine (PHENERGAN) 25 MG tablet Take 25 mg by mouth every 6 (six) hours as needed for nausea or vomiting.      Marland Kitchen azithromycin (ZITHROMAX) 500 MG tablet Take 500 mg by mouth daily.      . fluticasone (VERAMYST) 27.5 MCG/SPRAY nasal spray Place 2 sprays into the nose daily.       No current facility-administered medications for this visit.    No Known Allergies  Past Medical History  Diagnosis Date  . Coronary artery disease   . Hypertension   . Hyperlipidemia   . GERD (gastroesophageal reflux disease)   . Hypothyroidism   . Personal history of malignant neoplasm of bladder   . Asthma   . Myocardial infarction     2009 WITH STENTS    Past Surgical History  Procedure Laterality Date  . Coronary stent placement      of the right coronary artery and left anterior descending  . Bladder surgery    . Liver biopsy    . Ankle surgery      left  . Cystourethroscopy    . Tonsillectomy    . Colonoscopy  History  Smoking status  . Never Smoker   Smokeless tobacco  . Never Used    History  Alcohol Use  . Yes    Comment: seldom    Family History  Problem Relation Age of Onset  . Other      both of his parents were in their mid 54s when they died, neither one had heart disease  . Heart disease Brother     he developed heart disease after the age of 33  . Prostate cancer Father   . Breast cancer Sister   . Colon cancer Neg Hx   . Rectal cancer Neg Hx   . Stomach cancer Neg Hx     Review of Systems: The review of systems is per the HPI.  All other systems were reviewed and are negative.  Physical Exam: BP 138/72  Pulse 63  Ht 5\' 9"  (1.753 m)  Wt 184 lb 6.4 oz (83.643 kg)  BMI 27.22 kg/m2 Patient is very pleasant and in no acute distress. Skin is warm and dry. Color is normal.  HEENT is unremarkable. Normocephalic/atraumatic. PERRL. Sclera are nonicteric. Neck is supple. No masses. No  JVD. Lungs are clear. Cardiac exam shows a regular rate and rhythm. Abdomen is soft. Extremities are without edema. Gait and ROM are intact. Has limited mobility of the right arm.  No gross neurologic deficits noted.  Wt Readings from Last 3 Encounters:  12/20/13 184 lb 6.4 oz (83.643 kg)  08/15/13 184 lb (83.462 kg)  07/15/13 186 lb (84.369 kg)    LABORATORY DATA/PROCEDURES:  Lab Results  Component Value Date   WBC 9.1 10/24/2012   HGB 14.6 10/24/2012   HCT 42.0 10/24/2012   PLT 132* 10/24/2012   GLUCOSE 103* 10/24/2012   CHOL 153 01/09/2009   TRIG 123.0 01/09/2009   HDL 46.10 01/09/2009   LDLCALC 82 01/09/2009   ALT 39 10/23/2012   AST 22 10/23/2012   NA 137 10/24/2012   K 4.0 10/24/2012   CL 103 10/24/2012   CREATININE 1.00 10/24/2012   BUN 17 10/24/2012   CO2 26 10/24/2012   TSH 1.613 10/23/2012   INR 1.0 10/15/2012   HGBA1C 5.4 10/23/2012    BNP (last 3 results) No results found for this basename: PROBNP,  in the last 8760 hours  Procedure: Left Heart Cath, Selective Coronary Angiography, LV angiography  Indication: 80 year old white male with history of coronary disease. He is status post stenting of the proximal, mid, and distal RCA in 2009 for a STEMI. The LAD and ramus intermediate branch were also stented. He had recent symptoms of flushing without significant chest pain. A stress Myoview study was intermediate risk with a fixed inferior and inferoseptal defect. Ejection fraction was 40%.  Procedural Details: The right wrist was prepped, draped, and anesthetized with 1% lidocaine. Using the modified Seldinger technique, a 5 French sheath was introduced into the right radial artery. 3 mg of verapamil was administered through the sheath, weight-based unfractionated heparin was administered intravenously. Standard Judkins catheters were used for selective coronary angiography and left ventriculography. Catheter exchanges were performed over an exchange length guidewire. There were no  immediate procedural complications. A TR band was used for radial hemostasis at the completion of the procedure. The patient was transferred to the post catheterization recovery area for further monitoring.  Procedural Findings:  Hemodynamics:  AO 146/69 with a mean of 101 mmHg  LV 145/9 mmHg  Coronary angiography:  Coronary dominance: right  Left mainstem: The left  main coronary is moderately calcified. It has mild diffuse disease up to 20%.  Left anterior descending (LAD): The left anterior descending artery is a large vessel extending to the apex. It gives rise to a single moderate-sized diagonal branch. The stented segment in the proximal LAD has diffuse disease up to 30%. The first diagonal is without significant disease.  The ramus intermediate branch has a stent in the proximal vessel. It has mild diffuse disease up to 20%.  Left circumflex (LCx): The left circumflex is a relatively small vessel giving rise to a single small marginal branch. It has diffuse mild disease up to 30%.  Right coronary artery (RCA): The right coronary is occluded at the ostium. The entire right coronary fills by excellent left-to-right collaterals up to the mid vessel stent.  Left ventriculography: Left ventricular systolic function is normal, LVEF is estimated at 45%, there is inferior basal hypokinesis, there is no significant mitral regurgitation  Final Conclusions:  1. Single vessel occlusive coronary disease. The right coronary is occluded at the ostium. He does have excellent left-to-right collaterals. The stents in the LAD and ramus intermediate branch are patent.  2. Mild left ventricular dysfunction.  Recommendations: Continue medical management.  Collier [facility]  10/19/2012, 9:56 AM   Assessment / Plan: 1. Atypical chest pain - EKG ok today. He is better clinically. Will check some lab and make sure everything looks ok. He will need further testing if has recurrent symptoms.  2. Known CAD - last  cath about one year ago was stable following abnormal Myoview.  3. HTN - BP ok at present - I have left him on his current regimen.   Patient is agreeable to this plan and will call if any problems develop in the interim.   Burtis Junes, RN, [place] [address] [place], [place]  [id number] [phone]

## 2013-12-20 NOTE — Patient Instructions (Signed)
Let us know if you have any more spells  I would like to check some lab today   See Dr. Johnsie Cancel back as planned  Stay on your current medicines  Call the [place] office at [phone] if you have any questions, problems or concerns.

## 2013-12-21 LAB — BASIC METABOLIC PANEL
BUN: 28 mg/dL — ABNORMAL HIGH (ref 6–23)
CO2: 28 mEq/L (ref 19–32)
Calcium: 9 mg/dL (ref 8.4–10.5)
Chloride: 103 mEq/L (ref 96–112)
Creatinine, Ser: 1 mg/dL (ref 0.4–1.5)
GFR: 73.22 mL/min (ref >60.00)
Glucose, Bld: 84 mg/dL (ref 70–99)
Potassium: 4.6 mEq/L (ref 3.5–5.1)
Sodium: 137 mEq/L (ref 135–145)

## 2013-12-21 LAB — CBC
HCT: 41.6 % (ref 39.0–52.0)
Hemoglobin: 13.8 g/dL (ref 13.0–17.0)
MCHC: 33.1 g/dL (ref 30.0–36.0)
MCV: 87.3 fl (ref 78.0–100.0)
Platelets: 142 10*3/uL — ABNORMAL LOW (ref 150.0–400.0)
RBC: 4.76 Mil/uL (ref 4.22–5.81)
RDW: 14.2 % (ref 11.5–15.5)
WBC: 7.4 10*3/uL (ref 4.0–10.5)

## 2013-12-26 ENCOUNTER — Ambulatory Visit: Payer: Medicare Other | Admitting: Physician Assistant

## 2013-12-30 ENCOUNTER — Encounter (HOSPITAL_COMMUNITY): Payer: Self-pay | Admitting: Emergency Medicine

## 2013-12-30 ENCOUNTER — Emergency Department (HOSPITAL_COMMUNITY): Payer: Medicare Other

## 2013-12-30 ENCOUNTER — Inpatient Hospital Stay (HOSPITAL_COMMUNITY): Payer: Medicare Other

## 2013-12-30 DIAGNOSIS — Z7902 Long term (current) use of antithrombotics/antiplatelets: Secondary | ICD-10-CM

## 2013-12-30 DIAGNOSIS — E785 Hyperlipidemia, unspecified: Secondary | ICD-10-CM | POA: Diagnosis present

## 2013-12-30 DIAGNOSIS — I10 Essential (primary) hypertension: Secondary | ICD-10-CM | POA: Diagnosis present

## 2013-12-30 DIAGNOSIS — D649 Anemia, unspecified: Secondary | ICD-10-CM | POA: Diagnosis present

## 2013-12-30 DIAGNOSIS — I252 Old myocardial infarction: Secondary | ICD-10-CM

## 2013-12-30 DIAGNOSIS — I4901 Ventricular fibrillation: Principal | ICD-10-CM

## 2013-12-30 DIAGNOSIS — Z8249 Family history of ischemic heart disease and other diseases of the circulatory system: Secondary | ICD-10-CM

## 2013-12-30 DIAGNOSIS — Z803 Family history of malignant neoplasm of breast: Secondary | ICD-10-CM | POA: Diagnosis not present

## 2013-12-30 DIAGNOSIS — I4891 Unspecified atrial fibrillation: Secondary | ICD-10-CM | POA: Diagnosis not present

## 2013-12-30 DIAGNOSIS — R34 Anuria and oliguria: Secondary | ICD-10-CM | POA: Diagnosis not present

## 2013-12-30 DIAGNOSIS — J810 Acute pulmonary edema: Secondary | ICD-10-CM

## 2013-12-30 DIAGNOSIS — E872 Acidosis, unspecified: Secondary | ICD-10-CM | POA: Diagnosis present

## 2013-12-30 DIAGNOSIS — J690 Pneumonitis due to inhalation of food and vomit: Secondary | ICD-10-CM | POA: Diagnosis not present

## 2013-12-30 DIAGNOSIS — E876 Hypokalemia: Secondary | ICD-10-CM | POA: Diagnosis not present

## 2013-12-30 DIAGNOSIS — R570 Cardiogenic shock: Secondary | ICD-10-CM | POA: Diagnosis present

## 2013-12-30 DIAGNOSIS — Z8551 Personal history of malignant neoplasm of bladder: Secondary | ICD-10-CM

## 2013-12-30 DIAGNOSIS — I469 Cardiac arrest, cause unspecified: Secondary | ICD-10-CM | POA: Diagnosis present

## 2013-12-30 DIAGNOSIS — G9340 Encephalopathy, unspecified: Secondary | ICD-10-CM | POA: Diagnosis not present

## 2013-12-30 DIAGNOSIS — I5021 Acute systolic (congestive) heart failure: Secondary | ICD-10-CM | POA: Diagnosis present

## 2013-12-30 DIAGNOSIS — I4729 Other ventricular tachycardia: Secondary | ICD-10-CM | POA: Diagnosis present

## 2013-12-30 DIAGNOSIS — Z9861 Coronary angioplasty status: Secondary | ICD-10-CM

## 2013-12-30 DIAGNOSIS — R7309 Other abnormal glucose: Secondary | ICD-10-CM | POA: Diagnosis not present

## 2013-12-30 DIAGNOSIS — I472 Ventricular tachycardia, unspecified: Secondary | ICD-10-CM | POA: Diagnosis present

## 2013-12-30 DIAGNOSIS — J9601 Acute respiratory failure with hypoxia: Secondary | ICD-10-CM

## 2013-12-30 DIAGNOSIS — I2510 Atherosclerotic heart disease of native coronary artery without angina pectoris: Secondary | ICD-10-CM | POA: Diagnosis present

## 2013-12-30 DIAGNOSIS — D696 Thrombocytopenia, unspecified: Secondary | ICD-10-CM | POA: Diagnosis present

## 2013-12-30 DIAGNOSIS — Z7982 Long term (current) use of aspirin: Secondary | ICD-10-CM | POA: Diagnosis not present

## 2013-12-30 DIAGNOSIS — J9600 Acute respiratory failure, unspecified whether with hypoxia or hypercapnia: Secondary | ICD-10-CM | POA: Diagnosis present

## 2013-12-30 DIAGNOSIS — E039 Hypothyroidism, unspecified: Secondary | ICD-10-CM | POA: Diagnosis present

## 2013-12-30 DIAGNOSIS — I509 Heart failure, unspecified: Secondary | ICD-10-CM | POA: Diagnosis present

## 2013-12-30 DIAGNOSIS — N179 Acute kidney failure, unspecified: Secondary | ICD-10-CM | POA: Diagnosis not present

## 2013-12-30 DIAGNOSIS — I214 Non-ST elevation (NSTEMI) myocardial infarction: Secondary | ICD-10-CM | POA: Diagnosis not present

## 2013-12-30 DIAGNOSIS — Z8042 Family history of malignant neoplasm of prostate: Secondary | ICD-10-CM

## 2013-12-30 LAB — COMPREHENSIVE METABOLIC PANEL
ALT: 226 U/L — AB (ref 0–53)
AST: 182 U/L — AB (ref 0–37)
Albumin: 1.8 g/dL — ABNORMAL LOW (ref 3.5–5.2)
Alkaline Phosphatase: 74 U/L (ref 39–117)
Anion gap: 19 — ABNORMAL HIGH (ref 5–15)
BUN: 21 mg/dL (ref 6–23)
CO2: 16 meq/L — AB (ref 19–32)
Calcium: 7.6 mg/dL — ABNORMAL LOW (ref 8.4–10.5)
Chloride: 110 mEq/L (ref 96–112)
Creatinine, Ser: 1.1 mg/dL (ref 0.50–1.35)
GFR calc Af Amer: 72 mL/min — ABNORMAL LOW (ref >90)
GFR calc non Af Amer: 62 mL/min — ABNORMAL LOW (ref >90)
Glucose, Bld: 263 mg/dL — ABNORMAL HIGH (ref 70–99)
Potassium: 4.3 mEq/L (ref 3.7–5.3)
SODIUM: 145 meq/L (ref 137–147)
Total Protein: 3.5 g/dL — ABNORMAL LOW (ref 6.0–8.3)

## 2013-12-30 LAB — I-STAT ARTERIAL BLOOD GAS, ED
Acid-base deficit: 20 mmol/L — ABNORMAL HIGH (ref 0.0–2.0)
Bicarbonate: 12.1 mEq/L — ABNORMAL LOW (ref 20.0–24.0)
O2 Saturation: 99 %
PH ART: 6.879 — AB (ref 7.350–7.450)
Patient temperature: 98.6
TCO2: 14 mmol/L (ref 0–100)
pCO2 arterial: 64.5 mmHg (ref 35.0–45.0)
pO2, Arterial: 200 mmHg — ABNORMAL HIGH (ref 80.0–100.0)

## 2013-12-30 LAB — CBC WITH DIFFERENTIAL/PLATELET
Basophils Absolute: 0 10*3/uL (ref 0.0–0.1)
Basophils Relative: 0 % (ref 0–1)
EOS PCT: 1 % (ref 0–5)
Eosinophils Absolute: 0.1 10*3/uL (ref 0.0–0.7)
HCT: 33.4 % — ABNORMAL LOW (ref 39.0–52.0)
Hemoglobin: 10.6 g/dL — ABNORMAL LOW (ref 13.0–17.0)
LYMPHS ABS: 6.9 10*3/uL — AB (ref 0.7–4.0)
Lymphocytes Relative: 84 % — ABNORMAL HIGH (ref 12–46)
MCH: 29.6 pg (ref 26.0–34.0)
MCHC: 31.7 g/dL (ref 30.0–36.0)
MCV: 93.3 fL (ref 78.0–100.0)
Monocytes Absolute: 0.3 10*3/uL (ref 0.1–1.0)
Monocytes Relative: 3 % (ref 3–12)
NEUTROS PCT: 12 % — AB (ref 43–77)
Neutro Abs: 1 10*3/uL — ABNORMAL LOW (ref 1.7–7.7)
PLATELETS: 79 10*3/uL — AB (ref 150–400)
RBC: 3.58 MIL/uL — AB (ref 4.22–5.81)
RDW: 13.7 % (ref 11.5–15.5)
WBC: 8.3 10*3/uL (ref 4.0–10.5)

## 2013-12-30 LAB — I-STAT CHEM 8, ED
BUN: 26 mg/dL — ABNORMAL HIGH (ref 6–23)
CHLORIDE: 111 meq/L (ref 96–112)
CREATININE: 1.2 mg/dL (ref 0.50–1.35)
Calcium, Ion: 1.09 mmol/L — ABNORMAL LOW (ref 1.13–1.30)
GLUCOSE: 252 mg/dL — AB (ref 70–99)
HCT: 29 % — ABNORMAL LOW (ref 39.0–52.0)
Hemoglobin: 9.9 g/dL — ABNORMAL LOW (ref 13.0–17.0)
POTASSIUM: 4.1 meq/L (ref 3.7–5.3)
Sodium: 143 mEq/L (ref 137–147)
TCO2: 17 mmol/L (ref 0–100)

## 2013-12-30 LAB — PROTIME-INR
INR: 1.77 — ABNORMAL HIGH (ref 0.00–1.49)
Prothrombin Time: 20.6 seconds — ABNORMAL HIGH (ref 11.6–15.2)

## 2013-12-30 LAB — I-STAT TROPONIN, ED: Troponin i, poc: 0.01 ng/mL (ref 0.00–0.08)

## 2013-12-30 LAB — I-STAT CG4 LACTIC ACID, ED: Lactic Acid, Venous: 11.86 mmol/L — ABNORMAL HIGH (ref 0.5–2.2)

## 2013-12-30 MED ORDER — FENTANYL CITRATE 0.05 MG/ML IJ SOLN: 100.0000 ug | INTRAMUSCULAR | Status: DC | PRN

## 2013-12-30 MED ORDER — LIDOCAINE IN D5W 4-5 MG/ML-% IV SOLN
1.0000 mg/min | INTRAVENOUS | Status: DC
  Administered 2013-12-30: 2 mg/min via INTRAVENOUS
  Filled 2013-12-30: qty 500

## 2013-12-30 MED ORDER — CISATRACURIUM BOLUS VIA INFUSION
0.1000 mg/kg | Freq: Once | INTRAVENOUS | Status: DC
  Filled 2013-12-30: qty 9

## 2013-12-30 MED ORDER — EPINEPHRINE HCL 1 MG/ML IJ SOLN
0.5000 ug/min | INTRAMUSCULAR | Status: DC
  Filled 2013-12-30: qty 1

## 2013-12-30 MED ORDER — SODIUM BICARBONATE 8.4 % IV SOLN
INTRAVENOUS | Status: DC
  Administered 2013-12-30 – 2014-01-01 (×4): via INTRAVENOUS
  Filled 2013-12-30 (×6): qty 150

## 2013-12-30 MED ORDER — FAMOTIDINE IN NACL 20-0.9 MG/50ML-% IV SOLN
20.0000 mg | Freq: Two times a day (BID) | INTRAVENOUS | Status: DC
  Administered 2013-12-31 – 2014-01-01 (×5): 20 mg via INTRAVENOUS
  Filled 2013-12-30 (×6): qty 50

## 2013-12-30 MED ORDER — MIDAZOLAM HCL 2 MG/2ML IJ SOLN: 2.0000 mg | Freq: Once | INTRAMUSCULAR | Status: DC

## 2013-12-30 MED ORDER — LIDOCAINE IN D5W 4-5 MG/ML-% IV SOLN
1.0000 mg/min | INTRAVENOUS | Status: DC
  Filled 2013-12-30: qty 250

## 2013-12-30 MED ORDER — CETYLPYRIDINIUM CHLORIDE 0.05 % MT LIQD
7.0000 mL | Freq: Four times a day (QID) | OROMUCOSAL | Status: DC
  Administered 2013-12-31 – 2014-01-02 (×8): 7 mL via OROMUCOSAL

## 2013-12-30 MED ORDER — FENTANYL BOLUS VIA INFUSION
50.0000 ug | INTRAVENOUS | Status: DC | PRN
  Administered 2013-12-31: 50 ug via INTRAVENOUS
  Filled 2013-12-30: qty 50

## 2013-12-30 MED ORDER — SODIUM CHLORIDE 0.9 % IV SOLN
1.0000 mg/h | INTRAVENOUS | Status: DC
  Administered 2013-12-30: 2 mg/h via INTRAVENOUS
  Filled 2013-12-30: qty 10

## 2013-12-30 MED ORDER — ASPIRIN 300 MG RE SUPP
300.0000 mg | RECTAL | Status: AC
  Filled 2013-12-30: qty 1

## 2013-12-30 MED ORDER — SODIUM CHLORIDE 0.9 % IV SOLN: 2000.0000 mL | Freq: Once | INTRAVENOUS | Status: DC

## 2013-12-30 MED ORDER — MIDAZOLAM HCL 2 MG/2ML IJ SOLN
2.0000 mg | Freq: Once | INTRAMUSCULAR | Status: AC | PRN
Start: 2013-12-30 — End: 2013-12-30

## 2013-12-30 MED ORDER — SODIUM CHLORIDE 0.9 % IV SOLN
1.0000 ug/kg/min | INTRAVENOUS | Status: DC
  Administered 2013-12-30 – 2013-12-31 (×2): 1 ug/kg/min via INTRAVENOUS
  Filled 2013-12-30 (×2): qty 20

## 2013-12-30 MED ORDER — SODIUM CHLORIDE 0.9 % IV SOLN
2000.0000 mL | Freq: Once | INTRAVENOUS | Status: AC
  Administered 2013-12-30: 2000 mL via INTRAVENOUS

## 2013-12-30 MED ORDER — FENTANYL CITRATE 0.05 MG/ML IJ SOLN: 100.0000 ug | Freq: Once | INTRAMUSCULAR | Status: DC

## 2013-12-30 MED ORDER — SODIUM CHLORIDE 0.9 % IV SOLN
25.0000 ug/h | INTRAVENOUS | Status: DC
  Administered 2013-12-30 – 2013-12-31 (×2): 50 ug/h via INTRAVENOUS
  Filled 2013-12-30: qty 50

## 2013-12-30 MED ORDER — SODIUM CHLORIDE 0.9 % IV SOLN
1.0000 ug/kg/min | INTRAVENOUS | Status: DC
  Administered 2013-12-30: 1 ug/kg/min via INTRAVENOUS
  Filled 2013-12-30: qty 20

## 2013-12-30 MED ORDER — SODIUM CHLORIDE 0.9 % IV SOLN
250.0000 mL | INTRAVENOUS | Status: DC | PRN
  Administered 2013-12-31 – 2014-01-01 (×2): 250 mL via INTRAVENOUS

## 2013-12-30 MED ORDER — CISATRACURIUM BOLUS VIA INFUSION
0.0500 mg/kg | INTRAVENOUS | Status: DC | PRN
  Filled 2013-12-30: qty 5

## 2013-12-30 MED ORDER — CALCIUM CHLORIDE 10 % IV SOLN
INTRAVENOUS | Status: AC | PRN
  Administered 2013-12-30: 1 g via INTRAVENOUS

## 2013-12-30 MED ORDER — SODIUM BICARBONATE 8.4 % IV SOLN
INTRAVENOUS | Status: AC | PRN
  Administered 2013-12-30 (×4): 50 meq via INTRAVENOUS

## 2013-12-30 MED ORDER — AMIODARONE HCL IN DEXTROSE 360-4.14 MG/200ML-% IV SOLN
INTRAVENOUS | Status: AC
  Filled 2013-12-30: qty 200

## 2013-12-30 MED ORDER — INSULIN ASPART 100 UNIT/ML ~~LOC~~ SOLN
2.0000 [IU] | SUBCUTANEOUS | Status: DC
  Administered 2013-12-31: 6 [IU] via SUBCUTANEOUS

## 2013-12-30 MED ORDER — HEPARIN SODIUM (PORCINE) 5000 UNIT/ML IJ SOLN
5000.0000 [IU] | Freq: Three times a day (TID) | INTRAMUSCULAR | Status: DC
  Filled 2013-12-30 (×3): qty 1

## 2013-12-30 MED ORDER — CHLORHEXIDINE GLUCONATE 0.12 % MT SOLN
15.0000 mL | Freq: Two times a day (BID) | OROMUCOSAL | Status: DC
  Administered 2013-12-30 – 2014-01-01 (×5): 15 mL via OROMUCOSAL
  Filled 2013-12-30 (×5): qty 15

## 2013-12-30 MED ORDER — MAGNESIUM SULFATE 50 % IJ SOLN
INTRAMUSCULAR | Status: AC | PRN
  Administered 2013-12-30: 2 g via INTRAVENOUS

## 2013-12-30 MED ORDER — MIDAZOLAM BOLUS VIA INFUSION
2.0000 mg | INTRAVENOUS | Status: DC | PRN
  Administered 2013-12-31: 2 mg via INTRAVENOUS
  Filled 2013-12-30: qty 2

## 2013-12-30 MED ORDER — IPRATROPIUM-ALBUTEROL 0.5-2.5 (3) MG/3ML IN SOLN: 3.0000 mL | RESPIRATORY_TRACT | Status: DC | PRN

## 2013-12-30 MED ORDER — HEPARIN SODIUM (PORCINE) 5000 UNIT/ML IJ SOLN: 5000.0000 [IU] | Freq: Three times a day (TID) | INTRAMUSCULAR | Status: DC

## 2013-12-30 MED ORDER — SODIUM BICARBONATE 8.4 % IV SOLN
INTRAVENOUS | Status: AC
  Filled 2013-12-30: qty 50

## 2013-12-30 MED ORDER — SODIUM CHLORIDE 0.9 % IV SOLN
1.0000 mg/h | INTRAVENOUS | Status: DC
  Administered 2013-12-30 – 2013-12-31 (×3): 2 mg/h via INTRAVENOUS
  Filled 2013-12-30 (×3): qty 10

## 2013-12-30 MED ORDER — EPINEPHRINE HCL 0.1 MG/ML IJ SOSY
PREFILLED_SYRINGE | INTRAMUSCULAR | Status: AC | PRN
  Administered 2013-12-30 (×2): 1 mg via INTRAVENOUS

## 2013-12-30 MED ORDER — ARTIFICIAL TEARS OP OINT
1.0000 "application " | TOPICAL_OINTMENT | Freq: Three times a day (TID) | OPHTHALMIC | Status: DC
  Administered 2013-12-31 – 2014-01-01 (×5): 1 via OPHTHALMIC
  Filled 2013-12-30: qty 3.5

## 2013-12-30 MED ORDER — ATORVASTATIN CALCIUM 40 MG PO TABS
40.0000 mg | ORAL_TABLET | Freq: Every evening | ORAL | Status: DC
  Filled 2013-12-30: qty 1

## 2013-12-30 MED ORDER — LIDOCAINE HCL (CARDIAC) 20 MG/ML IV SOLN
INTRAVENOUS | Status: AC | PRN
  Administered 2013-12-30: 100 mg via INTRAVENOUS

## 2013-12-30 MED ORDER — LEVOTHYROXINE SODIUM 50 MCG PO TABS
50.0000 ug | ORAL_TABLET | Freq: Every day | ORAL | Status: DC
  Administered 2013-12-31 – 2014-01-01 (×2): 50 ug via ORAL
  Filled 2013-12-30 (×4): qty 1

## 2013-12-30 MED ORDER — STERILE WATER FOR INJECTION IV SOLN: INTRAVENOUS | Status: DC

## 2013-12-30 MED ORDER — NOREPINEPHRINE BITARTRATE 1 MG/ML IV SOLN
0.5000 ug/min | INTRAVENOUS | Status: DC
  Administered 2013-12-31: 10 ug/min via INTRAVENOUS
  Filled 2013-12-30 (×2): qty 4

## 2013-12-30 MED ORDER — SODIUM CHLORIDE 0.9 % IV SOLN
25.0000 ug/h | INTRAVENOUS | Status: DC
  Administered 2013-12-30: 50 ug/h via INTRAVENOUS
  Filled 2013-12-30: qty 50

## 2013-12-30 MED ORDER — LIDOCAINE IN D5W 4-5 MG/ML-% IV SOLN
2.0000 mg/min | INTRAVENOUS | Status: DC
  Administered 2013-12-30 – 2014-01-01 (×4): 2 mg/min via INTRAVENOUS
  Filled 2013-12-30 (×4): qty 500

## 2013-12-30 NOTE — ED Provider Notes (Signed)
CSN: [id number]     Arrival date & time 12/28/2013  2105 History   First MD Initiated Contact with Patient 01/20/2014 2112     Chief Complaint  Patient presents with  . CPR      (Consider location/radiation/quality/duration/timing/severity/associated sxs/prior Treatment) HPI Comments: Adrian Jensen 80 y.o.  Presents by EMS for cardiac arrest. Events surrounding the incidence are not exactly clear. Patient reportedly became pulseless and unconscious. Bystander CPR reportedly initiated. EMS arrived and patient was in V fib arrest. ACLS followed, defibrillated and then progressed with PEA arrested. Reportedly was coded for 40 minutes. ROSC en route, and again lost pulses prior to arrival, with again ROSC. On arrival, the patient was pulseless. Restarted CPR with return of ROSC. Patient received multiple doses of epinephrine in the field. He was loaded with 450 mg amiodarone. He does have a history of CAD, and is currently on plavix.    Past Medical History  Diagnosis Date  . Coronary artery disease   . Hypertension   . Hyperlipidemia   . GERD (gastroesophageal reflux disease)   . Hypothyroidism   . Personal history of malignant neoplasm of bladder   . Asthma   . Myocardial infarction     2009 WITH STENTS   Past Surgical History  Procedure Laterality Date  . Coronary stent placement      of the right coronary artery and left anterior descending  . Bladder surgery    . Liver biopsy    . Ankle surgery      left  . Cystourethroscopy    . Tonsillectomy    . Colonoscopy     Family History  Problem Relation Age of Onset  . Other      both of his parents were in their mid 19s when they died, neither one had heart disease  . Heart disease Brother     he developed heart disease after the age of 69  . Prostate cancer Father   . Breast cancer Sister   . Colon cancer Neg Hx   . Rectal cancer Neg Hx   . Stomach cancer Neg Hx    History  Substance Use Topics  . Smoking status: Never  Smoker   . Smokeless tobacco: Never Used  . Alcohol Use: Yes     Comment: seldom    Review of Systems  Unable to perform ROS: Intubated      Allergies  Review of patient's allergies indicates no known allergies.  Home Medications   Prior to Admission medications   Medication Sig Start Date End Date Taking? Authorizing Provider  aspirin 81 MG tablet Take 81 mg by mouth every evening.    Yes Historical Provider, MD  atorvastatin (LIPITOR) 40 MG tablet Take 40 mg by mouth every evening.   Yes Historical Provider, MD  Cholecalciferol 5000 UNITS TABS Take 5,000 Units by mouth daily.   Yes Historical Provider, MD  clopidogrel (PLAVIX) 75 MG tablet Take 75 mg by mouth daily.   Yes Historical Provider, MD  levothyroxine (SYNTHROID, LEVOTHROID) 50 MCG tablet Take 50 mcg by mouth daily.     Yes Historical Provider, MD  lisinopril (PRINIVIL,ZESTRIL) 5 MG tablet Take 1 tablet (5 mg total) by mouth daily. 11/09/12  Yes Josue Hector, MD  metoprolol succinate (TOPROL-XL) 25 MG 24 hr tablet Take 12.5 mg by mouth daily.   Yes Historical Provider, MD  nitroGLYCERIN (NITROSTAT) 0.4 MG SL tablet Place 1 tablet (0.4 mg total) under the tongue every 5 (five)  minutes as needed for chest pain. 12/19/13   Josue Hector, MD   BP 54/37  Pulse 94  Temp(Src) 92.3 F (33.5 C) (Core (Comment))  Resp 24  Wt 205 lb 4 oz (93.1 kg)  SpO2 82% Physical Exam Physical Exam  Constitutional: She appears well-developed. She appears distressed. She is intubated.  HENT: MMM and pink, ETT noted with some blood at the corner of the right mouth Head: Normocephalic and atraumatic.  Right Ear: External ear normal.  Left Ear: External ear normal.  Eyes:  Pupils 3 mm and sluggish bilaterally  Neck: No JVD present. No tracheal deviation present.  Patient is in a cervical collar  Pulmonary/Chest: She is intubated. She has no wheezes. Flail chest noted. Breathing spontaneously initially on arrival Ronchorous breath  sounds bilaterally with supported ventilations  Abdominal: Soft. She exhibits no distension.  Musculoskeletal:  No spontaneous movement of all four extremities.  Neurological: She is unresponsive. GCS eye subscore is 1. GCS verbal subscore is 1. GCS motor subscore is 1.  Skin: She is diaphoretic.   ED Course  Procedures (including critical care time) Labs Review Labs Reviewed  CBC WITH DIFFERENTIAL - Abnormal; Notable for the following:    RBC 3.58 (*)    Hemoglobin 10.6 (*)    HCT 33.4 (*)    Platelets 79 (*)    Neutrophils Relative % 12 (*)    Lymphocytes Relative 84 (*)    Neutro Abs 1.0 (*)    Lymphs Abs 6.9 (*)    All other components within normal limits  COMPREHENSIVE METABOLIC PANEL - Abnormal; Notable for the following:    CO2 16 (*)    Glucose, Bld 263 (*)    Calcium 7.6 (*)    Total Protein 3.5 (*)    Albumin 1.8 (*)    AST 182 (*)    ALT 226 (*)    Total Bilirubin <0.2 (*)    GFR calc non Af Amer 62 (*)    GFR calc Af Amer 72 (*)    Anion gap 19 (*)    All other components within normal limits  PROTIME-INR - Abnormal; Notable for the following:    Prothrombin Time 20.6 (*)    INR 1.77 (*)    All other components within normal limits  I-STAT CG4 LACTIC ACID, ED - Abnormal; Notable for the following:    Lactic Acid, Venous 11.86 (*)    All other components within normal limits  I-STAT CHEM 8, ED - Abnormal; Notable for the following:    BUN 26 (*)    Glucose, Bld 252 (*)    Calcium, Ion 1.09 (*)    Hemoglobin 9.9 (*)    HCT 29.0 (*)    All other components within normal limits  I-STAT ARTERIAL BLOOD GAS, ED - Abnormal; Notable for the following:    pH, Arterial 6.879 (*)    pCO2 arterial 64.5 (*)    pO2, Arterial 200.0 (*)    Bicarbonate 12.1 (*)    Acid-base deficit 20.0 (*)    All other components within normal limits  MRSA PCR SCREENING  BLOOD GAS, ARTERIAL  BLOOD GAS, ARTERIAL  BASIC METABOLIC PANEL  BASIC METABOLIC PANEL  BASIC METABOLIC  PANEL  BASIC METABOLIC PANEL  BASIC METABOLIC PANEL  BASIC METABOLIC PANEL  PROTIME-INR  PROTIME-INR  APTT  APTT  TROPONIN I  TROPONIN I  TROPONIN I  BASIC METABOLIC PANEL  BASIC METABOLIC PANEL  BASIC METABOLIC PANEL  BASIC METABOLIC PANEL  BASIC METABOLIC PANEL  BASIC METABOLIC PANEL  Randolm Idol, ED    Imaging Review Dg Chest Portable 1 View  01/21/2014   CLINICAL DATA:  Cardiac arrest.  Unresponsive.  CPR.  EXAM: PORTABLE CHEST - 1 VIEW  COMPARISON:  09/29/2012  FINDINGS: An endotracheal tube is been placed with tip measuring 4.6 cm above the carinal. Enteric tube tip is off the field of view but below the left hemidiaphragm. Shallow inspiration. Normal heart size and pulmonary vascularity. No focal airspace disease. No blunting of costophrenic angles. No pneumothorax.  IMPRESSION: Appliances appear in satisfactory location. Shallow inspiration. No active lung disease.   Electronically Signed   By: Lucienne Capers M.D.   On: 01/19/2014 22:02     EKG Interpretation   Date/Time:  Friday December 30 2013 21:37:35 EDT Ventricular Rate:  94 PR Interval:    QRS Duration: 129 QT Interval:  383 QTC Calculation: 479 R Axis:   94 Text Interpretation:  Atrial fibrillation Nonspecific intraventricular  conduction delay Repol abnrm, severe global ischemia (LM/MVD)  inferior/lateral ischemia noted Confirmed by Zenia Resides  MD, ANTHONY ([id number]) on  01/06/2014 10:00:18 PM      MDM   Final diagnoses:  None    Patient presents in cardiac arrest. Prolonged time coded, about 40 minutes. Received amiodarone in the field. ROSM just prior to arrival. On arrival he did have a pulse, but on recheck he no longer had a pulse. CPR resumed. See code sheet for full details. ROSC achieved and and he subsequently developed pulses VT and VF multiple time requiring defibrillation. PH 6.8. Gave 4 amps of bicarb as we suspected this was the etiology of his arrhythmia at this point. POCUS showed cardiac  activity. Cardiology and Critical quickly consulted. ECG sig for ST depression in the anterior leads, but given the instability of the patient, cardiology rec no cath at this time. Cards rec starting lidocaine which was given. Bicarb gtt started as well. He was quickly admitted to the ICU. Care and prognosis discussed with the family. He stabilized hemodynamically. Care discussed with my attending, Dr. Zenia Resides.     Kelby Aline, MD 12/31/13 [phone]

## 2013-12-30 NOTE — H&P (Signed)
Adrian Jensen is an 80 y.o. male.    Chief Complaint: cardiac arrest Primary Cardiologist: Dr. Johnsie Cancel HPI: Adrian Jensen is a 80 yo man with PMH of CAD with ramus and LAD stents as well as a total RCA with good collaterals from a 2009 STEMI, hypertension, dyslipidemia, hypothyroidism who yelled to his wife this evening and she went in found him unresponsive. She had her son-in-law come over from next door immediately and he started CPR while EMS on the way. Per report, he had approximately 40 minutes of CPR and ventricular fibrillation noted on monitor with several shocks. Cardiology and critical care consulted for cardiac arrest; he was initiated on cooling protocol in the EMS. On my arrival, he received approximately 3 shocks for ventricular fibrillation in approximately 5-8 minutes at the bedside. He had received 450 mg IV amiodarone and we opted to give lidocaine bolus and infusion and his arrhythmias calmed. His pH was noted to be 6.8. I discussed the findings with the interventionalist on call and given his profound acidemia and prolonged downtime requiring CPR, he is a poor candidate for cardiac catheterization at this time. Family arrived and I spent 15 minutes discussing our current findings and explaining the severity of his illness with poor prognosis at this time.    Past Medical History  Diagnosis Date  . Coronary artery disease   . Hypertension   . Hyperlipidemia   . GERD (gastroesophageal reflux disease)   . Hypothyroidism   . Personal history of malignant neoplasm of bladder   . Asthma   . Myocardial infarction     2009 WITH STENTS    Past Surgical History  Procedure Laterality Date  . Coronary stent placement      of the right coronary artery and left anterior descending  . Bladder surgery    . Liver biopsy    . Ankle surgery      left  . Cystourethroscopy    . Tonsillectomy    . Colonoscopy      Family History  Problem Relation Age of Onset  . Other      both  of his parents were in their mid 81s when they died, neither one had heart disease  . Heart disease Brother     he developed heart disease after the age of 7  . Prostate cancer Father   . Breast cancer Sister   . Colon cancer Neg Hx   . Rectal cancer Neg Hx   . Stomach cancer Neg Hx    Social History:  reports that he has never smoked. He has never used smokeless tobacco. He reports that he drinks alcohol. He reports that he does not use illicit drugs.  Allergies: No Known Allergies   (Not in a hospital admission)  Results for orders placed during the hospital encounter of 12/29/2013 (from the past 48 hour(s))  CBC WITH DIFFERENTIAL     Status: Abnormal (Preliminary result)   Collection Time    12/27/2013  9:25 PM      Result Value Ref Range   WBC 8.3  4.0 - 10.5 K/uL   RBC 3.58 (*) 4.22 - 5.81 MIL/uL   Hemoglobin 10.6 (*) 13.0 - 17.0 g/dL   HCT 33.4 (*) 39.0 - 52.0 %   MCV 93.3  78.0 - 100.0 fL   MCH 29.6  26.0 - 34.0 pg   MCHC 31.7  30.0 - 36.0 g/dL   RDW 13.7  11.5 - 15.5 %   Platelets  PENDING  150 - 400 K/uL   Neutrophils Relative % PENDING  43 - 77 %   Neutro Abs PENDING  1.7 - 7.7 K/uL   Band Neutrophils PENDING  0 - 10 %   Lymphocytes Relative PENDING  12 - 46 %   Lymphs Abs PENDING  0.7 - 4.0 K/uL   Monocytes Relative PENDING  3 - 12 %   Monocytes Absolute PENDING  0.1 - 1.0 K/uL   Eosinophils Relative PENDING  0 - 5 %   Eosinophils Absolute PENDING  0.0 - 0.7 K/uL   Basophils Relative PENDING  0 - 1 %   Basophils Absolute PENDING  0.0 - 0.1 K/uL   WBC Morphology PENDING     RBC Morphology PENDING     Smear Review PENDING     nRBC PENDING  0 /100 WBC   Metamyelocytes Relative PENDING     Myelocytes PENDING     Promyelocytes Absolute PENDING     Blasts PENDING    PROTIME-INR     Status: Abnormal   Collection Time    12/31/2013  9:25 PM      Result Value Ref Range   Prothrombin Time 20.6 (*) 11.6 - 15.2 seconds   INR 1.77 (*) 0.00 - 1.49  I-STAT TROPOININ, ED      Status: None   Collection Time    01/17/2014  9:27 PM      Result Value Ref Range   Troponin i, poc 0.01  0.00 - 0.08 ng/mL   Comment 3            Comment: Due to the release kinetics of cTnI,     a negative result within the first hours     of the onset of symptoms does not rule out     myocardial infarction with certainty.     If myocardial infarction is still suspected,     repeat the test at appropriate intervals.  I-STAT CG4 LACTIC ACID, ED     Status: Abnormal   Collection Time    01/05/2014  9:29 PM      Result Value Ref Range   Lactic Acid, Venous 11.86 (*) 0.5 - 2.2 mmol/L  I-STAT CHEM 8, ED     Status: Abnormal   Collection Time    01/16/2014  9:29 PM      Result Value Ref Range   Sodium 143  137 - 147 mEq/L   Potassium 4.1  3.7 - 5.3 mEq/L   Chloride 111  96 - 112 mEq/L   BUN 26 (*) 6 - 23 mg/dL   Creatinine, Ser 1.20  0.50 - 1.35 mg/dL   Glucose, Bld 252 (*) 70 - 99 mg/dL   Calcium, Ion 1.09 (*) 1.13 - 1.30 mmol/L   TCO2 17  0 - 100 mmol/L   Hemoglobin 9.9 (*) 13.0 - 17.0 g/dL   HCT 29.0 (*) 39.0 - 52.0 %  I-STAT ARTERIAL BLOOD GAS, ED     Status: Abnormal   Collection Time    12/27/2013  9:29 PM      Result Value Ref Range   pH, Arterial 6.879 (*) 7.350 - 7.450   pCO2 arterial 64.5 (*) 35.0 - 45.0 mmHg   pO2, Arterial 200.0 (*) 80.0 - 100.0 mmHg   Bicarbonate 12.1 (*) 20.0 - 24.0 mEq/L   TCO2 14  0 - 100 mmol/L   O2 Saturation 99.0     Acid-base deficit 20.0 (*) 0.0 - 2.0 mmol/L  Patient temperature 98.6 F     Collection site RADIAL, ALLEN'S TEST ACCEPTABLE     Drawn by RT     Sample type ARTERIAL     Comment NOTIFIED PHYSICIAN     Dg Chest Portable 1 View  01/01/2014   CLINICAL DATA:  Cardiac arrest.  Unresponsive.  CPR.  EXAM: PORTABLE CHEST - 1 VIEW  COMPARISON:  09/29/2012  FINDINGS: An endotracheal tube is been placed with tip measuring 4.6 cm above the carinal. Enteric tube tip is off the field of view but below the left hemidiaphragm. Shallow  inspiration. Normal heart size and pulmonary vascularity. No focal airspace disease. No blunting of costophrenic angles. No pneumothorax.  IMPRESSION: Appliances appear in satisfactory location. Shallow inspiration. No active lung disease.   Electronically Signed   By: Lucienne Capers M.D.   On: 01/01/2014 22:02    Review of Systems  Unable to perform ROS: critical illness    Blood pressure 100/58, pulse 70, weight 83.6 kg (184 lb 4.9 oz). Physical Exam  Nursing note and vitals reviewed. Constitutional: He appears distressed.  Appears stated age but critically ill  HENT:  Intubated and sedated; ETT in place; some blood around ETT  Eyes:  Intubated  Neck: No tracheal deviation present.  Cardiovascular: Normal heart sounds and intact distal pulses.  Exam reveals no gallop.   No murmur heard. Irregularly irregular  Respiratory: He is in respiratory distress. He has wheezes. He has rales.  Intubated and sedated  GI: Soft. Bowel sounds are normal. He exhibits no distension. There is no tenderness. There is no rebound.  Musculoskeletal:  Trace LEE bilaterally  Neurological:  Intubated, critically ill  Skin: He is diaphoretic.  lukewarm    Labs reviewing as they came in; ventricular fibrillation noted on monitor 3x with subsequent defibrillations (successful) ECG with St depression and atrial fibrillation PH 6.8  Assessment/Plan Adrian Jensen is a 80 yo man with extensive CAD who had a cardiac arrest with first rhythm noted to be ventricular fibrillation requiring 40+ minutes of CPR and multiple shocks now intubated and being cooled. Family updated about grave prognosis given. Significant acidemia and electrolytes from initial lab draw fairly unrevealing.  1. Cardiac arrest: prolonged CPR, currently being cooled, no overt ST elevation noted. ECG with ST depression and atrial fibrillation. Critical Care helping managed. Much appreciated. Cooling protocol for now. Lidocaine gtt for VT/VF.    2. Ventricular fibrillation: on lidocaine gtt for now. Received amiodarone 450 mg IV in total boluses before.  3. Coronary artery disease: rectal aspirin, defer heparin gtt for now given upcoming procedures (central line, arterial line) and evolving evaluation of clinical picture 4. Hypertension: permissive hypertension; holding home lisinopril and metoprolol 5. Dyslipidemia: statin therapy when able with home atorvastatin 40 mg 6. Hypothyroidism: continue synthroid   Adrian Jensen 01/06/2014, 10:04 PM

## 2013-12-30 NOTE — ED Provider Notes (Signed)
I saw and evaluated the patient, reviewed the resident's note and I agree with the findings and plan.   EKG Interpretation   Date/Time:  Friday December 30 2013 21:37:35 EDT Ventricular Rate:  94 PR Interval:    QRS Duration: 129 QT Interval:  383 QTC Calculation: 479 R Axis:   94 Text Interpretation:  Atrial fibrillation Nonspecific intraventricular  conduction delay Repol abnrm, severe global ischemia (LM/MVD)  inferior/lateral ischemia noted Confirmed by Zenia Resides  MD, Vandy Fong ([id number]) on  01/21/2014 10:00:18 PM      Patient here after having a witnessed arrest with bystander CPR x20 minutes. Patient lost his pulses and again the multiple times in route and was given ACLS protocol. On arrival he had he had return of pulses but deteriorated here and required multiple shocks and acls medications. we were able to obtain a stable blood pressure and patient did receive bolus of lidocaine and started on a lidocaine drip. He was also probably acidotic and received multiple rounds of sodium bicarbonate and was placed on a bicarbonate drip. I spoke with critical care as well as cardiology. Hyperthermia protocol was started and patient did not meet criteria for STEMI activation. I consulted with the family and patient is to be admitted to the ICU at this time  CRITICAL CARE Performed by: Leota Jacobsen Total critical care time: 60 Critical care time was exclusive of separately billable procedures and treating other patients. Critical care was necessary to treat or prevent imminent or life-threatening deterioration. Critical care was time spent personally by me on the following activities: development of treatment plan with patient and/or surrogate as well as nursing, discussions with consultants, evaluation of patient's response to treatment, examination of patient, obtaining history from patient or surrogate, ordering and performing treatments and interventions, ordering and review of laboratory  studies, ordering and review of radiographic studies, pulse oximetry and re-evaluation of patient's condition.     Leota Jacobsen, MD 01/18/2014 2202

## 2013-12-30 NOTE — H&P (Signed)
PULMONARY / CRITICAL CARE MEDICINE HISTORY AND PHYSICAL EXAMINATION   Name: Adrian Jensen MRN: [id number] DOB: 06/16/1932    ADMISSION DATE:  01/22/2014  PRIMARY SERVICE: PCCM  CHIEF COMPLAINT:  Cardiac arrest  BRIEF PATIENT DESCRIPTION:  80 yo man with PMH of CAD with h/o 2009 STEMI s/p stenting, hypertension, dyslipidemia, hypothyroidism who had a witnessed Vfib arrest and ROSC after CPR in the field.   SIGNIFICANT EVENTS / STUDIES:  12/28/2013: Witnessed Vfib arrest in the field with ROSC after approx 40 min  LINES / TUBES: 01/11/2014: Foley catheter 01/09/2014: R tibial IO   CULTURES: None  ANTIBIOTICS: None  HISTORY OF PRESENT ILLNESS:  Adrian Jensen is a 80 yo man with PMH of CAD with ramus and LAD stents as well as a total RCA with good collaterals from a 2009 STEMI, hypertension, dyslipidemia, hypothyroidism who presented after witnessed Vfib arrest with ROSC after CPR.  To review, he was at home and in his usual state of health until approx 1930 today.  At that time, he called out to his wife.  She found him cool and diaphoretic.  He was not complaining of chest pain.  His wife went to call for help and when she returned she found him unresponsive.  A family member immediately started CPR which EMS continued.  Initial rhythm by EMS was Vfib.  He had ROSC after approx 40 min while in the field and cooling protocol was initiated.  Shortly after arrival here, he decompensated again into Vfib arrest.  He received 450mg  IV amio and was shocked 3 times.  Also started on a lido gtt as well as a bicarb gtt given pH of 6.87.  He had ROSC with these interventions.  At that the time of my exam he is more HD stable on a lido and bicarb gtts.       PAST MEDICAL HISTORY :  Past Medical History  Diagnosis Date  . Coronary artery disease   . Hypertension   . Hyperlipidemia   . GERD (gastroesophageal reflux disease)   . Hypothyroidism   . Personal history of malignant neoplasm of bladder    . Asthma   . Myocardial infarction     2009 WITH STENTS   Past Surgical History  Procedure Laterality Date  . Coronary stent placement      of the right coronary artery and left anterior descending  . Bladder surgery    . Liver biopsy    . Ankle surgery      left  . Cystourethroscopy    . Tonsillectomy    . Colonoscopy     Prior to Admission medications   Medication Sig Start Date End Date Taking? Authorizing Provider  aspirin 81 MG tablet Take 81 mg by mouth every evening.    Yes Historical Provider, MD  atorvastatin (LIPITOR) 40 MG tablet Take 40 mg by mouth every evening.   Yes Historical Provider, MD  Cholecalciferol 5000 UNITS TABS Take 5,000 Units by mouth daily.   Yes Historical Provider, MD  clopidogrel (PLAVIX) 75 MG tablet Take 75 mg by mouth daily.   Yes Historical Provider, MD  levothyroxine (SYNTHROID, LEVOTHROID) 50 MCG tablet Take 50 mcg by mouth daily.     Yes Historical Provider, MD  lisinopril (PRINIVIL,ZESTRIL) 5 MG tablet Take 1 tablet (5 mg total) by mouth daily. 11/09/12  Yes Josue Hector, MD  metoprolol succinate (TOPROL-XL) 25 MG 24 hr tablet Take 12.5 mg by mouth daily.   Yes Historical  Provider, MD  nitroGLYCERIN (NITROSTAT) 0.4 MG SL tablet Place 1 tablet (0.4 mg total) under the tongue every 5 (five) minutes as needed for chest pain. 12/19/13   Josue Hector, MD   No Known Allergies  FAMILY HISTORY:  Unable to obtain as pt intubated and sedated  SOCIAL HISTORY: Unable to obtain but lifelong nonsmoker by report  REVIEW OF SYSTEMS:  Unable to obtain as pt is intubated and sedated  SUBJECTIVE:   VITAL SIGNS: Temp:  [93 F (33.9 C)] 93 F (33.9 C) (09/04 2212) Pulse Rate:  [45-94] 89 (09/04 2220) Resp:  [12-29] 24 (09/04 2220) BP: (100-156)/(58-86) 138/64 mmHg (09/04 2220) SpO2:  [96 %-100 %] 100 % (09/04 2220) FiO2 (%):  [100 %] 100 % (09/04 2100) Weight:  [184 lb 4.9 oz (83.6 kg)] 184 lb 4.9 oz (83.6 kg) (09/04 2100) HEMODYNAMICS:    VENTILATOR SETTINGS: Vent Mode:  [-] PRVC FiO2 (%):  [100 %] 100 % Set Rate:  [24 bmp] 24 bmp Vt Set:  [782 mL] 620 mL PEEP:  [5 cmH20] 5 cmH20 Plateau Pressure:  [20 cmH20] 20 cmH20 INTAKE / OUTPUT: Intake/Output   None     PHYSICAL EXAMINATION: General: Intubated and sedated Neuro:  Pupils 52mm, round bilaterally.  Sluggish but reactive.   HEENT:  ETT in place Cardiovascular:  Irregularly irregular, no murmurs Lungs: Diffusely rhonchorous and with exp wheezes Abdomen:  +BS Skin:  No rashes  LABS:  CBC  Recent Labs Lab 01/18/2014 2125 01/23/2014 2129  WBC 8.3  --   HGB 10.6* 9.9*  HCT 33.4* 29.0*  PLT 79*  --    Coag's  Recent Labs Lab 12/31/2013 2125  INR 1.77*   BMET  Recent Labs Lab 01/06/2014 2125 01/03/2014 2129  NA 145 143  K 4.3 4.1  CL 110 111  CO2 16*  --   BUN 21 26*  CREATININE 1.10 1.20  GLUCOSE 263* 252*   Electrolytes  Recent Labs Lab 12/28/2013 2125  CALCIUM 7.6*   Sepsis Markers  Recent Labs Lab 01/05/2014 2129  LATICACIDVEN 11.86*   ABG  Recent Labs Lab 12/31/2013 2129  PHART 6.879*  PCO2ART 64.5*  PO2ART 200.0*   Liver Enzymes  Recent Labs Lab 01/25/2014 2125  AST 182*  ALT 226*  ALKPHOS 74  BILITOT <0.2*  ALBUMIN 1.8*   Cardiac Enzymes No results found for this basename: TROPONINI, PROBNP,  in the last 168 hours Glucose No results found for this basename: GLUCAP,  in the last 168 hours  Imaging Dg Chest Portable 1 View  01/14/2014   CLINICAL DATA:  Cardiac arrest.  Unresponsive.  CPR.  EXAM: PORTABLE CHEST - 1 VIEW  COMPARISON:  09/29/2012  FINDINGS: An endotracheal tube is been placed with tip measuring 4.6 cm above the carinal. Enteric tube tip is off the field of view but below the left hemidiaphragm. Shallow inspiration. Normal heart size and pulmonary vascularity. No focal airspace disease. No blunting of costophrenic angles. No pneumothorax.  IMPRESSION: Appliances appear in satisfactory location. Shallow  inspiration. No active lung disease.   Electronically Signed   By: Lucienne Capers M.D.   On: 01/08/2014 22:02    EKG: ST depression, Afib CXR: ETT in place, lung fields clear, cardiac silhouette not enlarged   ASSESSMENT / PLAN:  Principal Problem:   Cardiac arrest Active Problems:   HYPERLIPIDEMIA   HYPERTENSION   CAD   Ventricular fibrillation   PULMONARY A: Hypercarbic respiratory failure:  Due to cardiac arrest and inadequate  breaths P:   Cont mechanical ventilation VAP bundle PRN duonebs  CARDIOVASCULAR A: Vfib Cardiac Arrest: Currently on hypothermia protocol.  To unstable for cath lab at this time  P:   Cont hypothermia protocol Cardiology following, appreciate assistance Cont Lido gtt Rectal ASA Will defer heparin gtt for now given evolving clinical picture Cycling CE's Allow permissive HTN Cont home atorvastatin Defer to cardiology on cath decision  RENAL A: Metabolic acidosis: Due to poor tissue perfusion  P:   Cont bicarb gtt at 100/hr for now Monitor ABG's Foley for UOP monitoring  GASTROINTESTINAL A: Need for nutrition P:   NPO for now Nutrition c/s for tube feed recs  HEMATOLOGIC A: No acute issues P:   Heparin gtt as above  INFECTIOUS A: No acute issues P:   Monitor for signs of infection  ENDOCRINE A: No h/o DM but hyperglycemic on admit P:   SSI  NEUROLOGIC A: Intubated, sedated, paralyzed  P:   Fentanyl gtt Midaz gtt Cisatracurium gtt per hypothermia protocol  BEST PRACTICE / DISPOSITION Level of Care:  ICU Primary Service:  PCCM Consultants:  Cardiology Code Status:  Full Diet:  NPO DVT Px:  Heparin subq  GI Px:  Famotidine Skin Integrity:  No acute issues, q2 turning protocol Social / Family: Cardiology and I had approx 15 min discussion with family about poor prognosis given initial pH and prolonged CPR time.  Family wishes to continue aggressive ICU care for now.    TODAY'S SUMMARY: 80 yo man with PMH  of CAD with ramus and LAD stents as well as a total RCA with good collaterals from a 2009 STEMI, hypertension, dyslipidemia, hypothyroidism who presented after witnessed Vfib arrest with ROSC after CPR.  Currently cooled and more HD stable on lido and bicarb gtt.    I have personally obtained a history, examined the patient, evaluated laboratory and imaging results, formulated the assessment and plan and placed orders.  CRITICAL CARE: The patient is critically ill with multiple organ systems failure and requires high complexity decision making for assessment and support, frequent evaluation and titration of therapies, application of advanced monitoring technologies and extensive interpretation of multiple databases. Critical Care Time devoted to patient care services described in this note is 65 minutes.   Lucrezia Starch, MD Pulmonary and [place] Pager: [phone]   12/29/2013, 10:39 PM

## 2013-12-30 NOTE — Progress Notes (Signed)
Chaplain responded to the Nursing Sec. Call re: pt. Experiencing CPR and [employer]. Large family was located in the consultation room C.  Chaplain accompanied MD to talk with family, then brought wife and 2 daughters to Trauma Room to see pt. Situation.  After further consultation with other MDs, Chaplain helped family return to consultation room.  Comfort measures provided.  Will follow.  Rev. Clois Dupes [phone]

## 2013-12-30 NOTE — ED Notes (Signed)
Pt in via EMS, pt was had a witnessed arrest with 10 min of bystanders doing CPR, 45 min of CPR with EMS, pulses were gained once for two min then lost again, pulses gained again 5 min PTA and patient was being paced, given 2L cold NS en route, upon arrival pt without pulses- CPR started upon arrival

## 2013-12-30 NOTE — ED Notes (Signed)
CG-4 and Chem-8 reported to Dr. Lucrezia Starch and Zenia Resides in the room.

## 2013-12-31 ENCOUNTER — Encounter (HOSPITAL_COMMUNITY): Payer: Self-pay | Admitting: *Deleted

## 2013-12-31 DIAGNOSIS — J9600 Acute respiratory failure, unspecified whether with hypoxia or hypercapnia: Secondary | ICD-10-CM

## 2013-12-31 DIAGNOSIS — I4901 Ventricular fibrillation: Principal | ICD-10-CM

## 2013-12-31 DIAGNOSIS — R570 Cardiogenic shock: Secondary | ICD-10-CM

## 2013-12-31 DIAGNOSIS — J810 Acute pulmonary edema: Secondary | ICD-10-CM

## 2013-12-31 DIAGNOSIS — J690 Pneumonitis due to inhalation of food and vomit: Secondary | ICD-10-CM

## 2013-12-31 DIAGNOSIS — I10 Essential (primary) hypertension: Secondary | ICD-10-CM

## 2013-12-31 DIAGNOSIS — I469 Cardiac arrest, cause unspecified: Secondary | ICD-10-CM

## 2013-12-31 LAB — GLUCOSE, CAPILLARY
GLUCOSE-CAPILLARY: 323 mg/dL — AB (ref 70–99)
GLUCOSE-CAPILLARY: 334 mg/dL — AB (ref 70–99)
GLUCOSE-CAPILLARY: 319 mg/dL — AB (ref 70–99)
GLUCOSE-CAPILLARY: 297 mg/dL — AB (ref 70–99)
GLUCOSE-CAPILLARY: 294 mg/dL — AB (ref 70–99)
GLUCOSE-CAPILLARY: 350 mg/dL — AB (ref 70–99)
GLUCOSE-CAPILLARY: 349 mg/dL — AB (ref 70–99)
GLUCOSE-CAPILLARY: 484 mg/dL — AB (ref 70–99)
GLUCOSE-CAPILLARY: 201 mg/dL — AB (ref 70–99)
GLUCOSE-CAPILLARY: 264 mg/dL — AB (ref 70–99)
Glucose-Capillary: 271 mg/dL — ABNORMAL HIGH (ref 70–99)
Glucose-Capillary: 315 mg/dL — ABNORMAL HIGH (ref 70–99)
Glucose-Capillary: 260 mg/dL — ABNORMAL HIGH (ref 70–99)
Glucose-Capillary: 343 mg/dL — ABNORMAL HIGH (ref 70–99)
Glucose-Capillary: 360 mg/dL — ABNORMAL HIGH (ref 70–99)
Glucose-Capillary: 367 mg/dL — ABNORMAL HIGH (ref 70–99)
Glucose-Capillary: 375 mg/dL — ABNORMAL HIGH (ref 70–99)
Glucose-Capillary: 487 mg/dL — ABNORMAL HIGH (ref 70–99)
Glucose-Capillary: 526 mg/dL — ABNORMAL HIGH (ref 70–99)
Glucose-Capillary: 534 mg/dL — ABNORMAL HIGH (ref 70–99)
Glucose-Capillary: 248 mg/dL — ABNORMAL HIGH (ref 70–99)
Glucose-Capillary: 255 mg/dL — ABNORMAL HIGH (ref 70–99)

## 2013-12-31 LAB — BASIC METABOLIC PANEL
Anion gap: 16 — ABNORMAL HIGH (ref 5–15)
Anion gap: 13 (ref 5–15)
Anion gap: 13 (ref 5–15)
Anion gap: 15 (ref 5–15)
BUN: 29 mg/dL — ABNORMAL HIGH (ref 6–23)
BUN: 26 mg/dL — AB (ref 6–23)
BUN: 29 mg/dL — AB (ref 6–23)
BUN: 34 mg/dL — ABNORMAL HIGH (ref 6–23)
CALCIUM: 7.7 mg/dL — AB (ref 8.4–10.5)
CALCIUM: 6.4 mg/dL — AB (ref 8.4–10.5)
CALCIUM: 6.8 mg/dL — AB (ref 8.4–10.5)
CO2: 17 mEq/L — ABNORMAL LOW (ref 19–32)
CO2: 16 mEq/L — ABNORMAL LOW (ref 19–32)
CO2: 23 mEq/L (ref 19–32)
CO2: 18 mEq/L — ABNORMAL LOW (ref 19–32)
Calcium: 6.3 mg/dL — CL (ref 8.4–10.5)
Chloride: 108 mEq/L (ref 96–112)
Chloride: 110 mEq/L (ref 96–112)
Chloride: 102 mEq/L (ref 96–112)
Chloride: 103 mEq/L (ref 96–112)
Creatinine, Ser: 1.25 mg/dL (ref 0.50–1.35)
Creatinine, Ser: 1.05 mg/dL (ref 0.50–1.35)
Creatinine, Ser: 1.12 mg/dL (ref 0.50–1.35)
Creatinine, Ser: 1.39 mg/dL — ABNORMAL HIGH (ref 0.50–1.35)
GFR calc non Af Amer: 53 mL/min — ABNORMAL LOW (ref >90)
GFR calc non Af Amer: 61 mL/min — ABNORMAL LOW (ref >90)
GFR, EST AFRICAN AMERICAN: 61 mL/min — AB (ref >90)
GFR, EST AFRICAN AMERICAN: 76 mL/min — AB (ref >90)
GFR, EST AFRICAN AMERICAN: 70 mL/min — AB (ref >90)
GFR, EST AFRICAN AMERICAN: 54 mL/min — AB (ref >90)
GFR, EST NON AFRICAN AMERICAN: 65 mL/min — AB (ref >90)
GFR, EST NON AFRICAN AMERICAN: 47 mL/min — AB (ref >90)
GLUCOSE: 354 mg/dL — AB (ref 70–99)
GLUCOSE: 520 mg/dL — AB (ref 70–99)
Glucose, Bld: 341 mg/dL — ABNORMAL HIGH (ref 70–99)
Glucose, Bld: 281 mg/dL — ABNORMAL HIGH (ref 70–99)
POTASSIUM: 3.3 meq/L — AB (ref 3.7–5.3)
POTASSIUM: 5.5 meq/L — AB (ref 3.7–5.3)
POTASSIUM: 2.7 meq/L — AB (ref 3.7–5.3)
POTASSIUM: 4.3 meq/L (ref 3.7–5.3)
SODIUM: 141 meq/L (ref 137–147)
SODIUM: 139 meq/L (ref 137–147)
SODIUM: 136 meq/L — AB (ref 137–147)
Sodium: 138 mEq/L (ref 137–147)

## 2013-12-31 LAB — POCT I-STAT 3, ART BLOOD GAS (G3+)
ACID-BASE DEFICIT: 6 mmol/L — AB (ref 0.0–2.0)
Acid-base deficit: 10 mmol/L — ABNORMAL HIGH (ref 0.0–2.0)
BICARBONATE: 20.8 meq/L (ref 20.0–24.0)
Bicarbonate: 16.9 mEq/L — ABNORMAL LOW (ref 20.0–24.0)
O2 SAT: 81 %
O2 Saturation: 97 %
PCO2 ART: 33 mmHg — AB (ref 35.0–45.0)
PCO2 ART: 42.1 mmHg (ref 35.0–45.0)
PH ART: 7.297 — AB (ref 7.350–7.450)
Patient temperature: 94
Patient temperature: 33.2
TCO2: 22 mmol/L (ref 0–100)
TCO2: 18 mmol/L (ref 0–100)
pH, Arterial: 7.288 — ABNORMAL LOW (ref 7.350–7.450)
pO2, Arterial: 44 mmHg — ABNORMAL LOW (ref 80.0–100.0)
pO2, Arterial: 82 mmHg (ref 80.0–100.0)

## 2013-12-31 LAB — BLOOD GAS, ARTERIAL
Acid-base deficit: 5.8 mmol/L — ABNORMAL HIGH (ref 0.0–2.0)
Bicarbonate: 19.9 mEq/L — ABNORMAL LOW (ref 20.0–24.0)
Drawn by: 369891
FIO2: 100 %
LHR: 30 {breaths}/min
O2 SAT: 83.2 %
PATIENT TEMPERATURE: 91.4
PEEP/CPAP: 10 cmH2O
PH ART: 7.326 — AB (ref 7.350–7.450)
TCO2: 21.2 mmol/L (ref 0–100)
VT: 550 mL
pCO2 arterial: 36.8 mmHg (ref 35.0–45.0)
pO2, Arterial: 43.1 mmHg — ABNORMAL LOW (ref 80.0–100.0)

## 2013-12-31 LAB — CBC WITH DIFFERENTIAL/PLATELET
BASOS PCT: 0 % (ref 0–1)
Basophils Absolute: 0 10*3/uL (ref 0.0–0.1)
Eosinophils Absolute: 0 10*3/uL (ref 0.0–0.7)
Eosinophils Relative: 0 % (ref 0–5)
HCT: 31.3 % — ABNORMAL LOW (ref 39.0–52.0)
HEMOGLOBIN: 10.4 g/dL — AB (ref 13.0–17.0)
LYMPHS PCT: 35 % (ref 12–46)
Lymphs Abs: 3.2 10*3/uL (ref 0.7–4.0)
MCH: 29.3 pg (ref 26.0–34.0)
MCHC: 33.2 g/dL (ref 30.0–36.0)
MCV: 88.2 fL (ref 78.0–100.0)
MONOS PCT: 7 % (ref 3–12)
Monocytes Absolute: 0.6 10*3/uL (ref 0.1–1.0)
NEUTROS PCT: 58 % (ref 43–77)
Neutro Abs: 5.4 10*3/uL (ref 1.7–7.7)
Platelets: 40 10*3/uL — ABNORMAL LOW (ref 150–400)
RBC: 3.55 MIL/uL — ABNORMAL LOW (ref 4.22–5.81)
RDW: 13.9 % (ref 11.5–15.5)
WBC MORPHOLOGY: INCREASED
WBC: 9.2 10*3/uL (ref 4.0–10.5)

## 2013-12-31 LAB — POCT I-STAT, CHEM 8
BUN: 22 mg/dL (ref 6–23)
BUN: 27 mg/dL — ABNORMAL HIGH (ref 6–23)
BUN: 29 mg/dL — AB (ref 6–23)
CALCIUM ION: 1.31 mmol/L — AB (ref 1.13–1.30)
CHLORIDE: 109 meq/L (ref 96–112)
CHLORIDE: 113 meq/L — AB (ref 96–112)
CREATININE: 1.2 mg/dL (ref 0.50–1.35)
Calcium, Ion: 1.2 mmol/L (ref 1.13–1.30)
Calcium, Ion: 1.22 mmol/L (ref 1.13–1.30)
Chloride: 111 mEq/L (ref 96–112)
Creatinine, Ser: 1.1 mg/dL (ref 0.50–1.35)
Creatinine, Ser: 1.2 mg/dL (ref 0.50–1.35)
GLUCOSE: 347 mg/dL — AB (ref 70–99)
Glucose, Bld: 383 mg/dL — ABNORMAL HIGH (ref 70–99)
Glucose, Bld: 365 mg/dL — ABNORMAL HIGH (ref 70–99)
HCT: 40 % (ref 39.0–52.0)
HEMATOCRIT: 38 % — AB (ref 39.0–52.0)
HEMATOCRIT: 39 % (ref 39.0–52.0)
Hemoglobin: 12.9 g/dL — ABNORMAL LOW (ref 13.0–17.0)
Hemoglobin: 13.3 g/dL (ref 13.0–17.0)
Hemoglobin: 13.6 g/dL (ref 13.0–17.0)
POTASSIUM: 2.7 meq/L — AB (ref 3.7–5.3)
POTASSIUM: 2.9 meq/L — AB (ref 3.7–5.3)
Potassium: 3.1 mEq/L — ABNORMAL LOW (ref 3.7–5.3)
SODIUM: 141 meq/L (ref 137–147)
Sodium: 140 mEq/L (ref 137–147)
Sodium: 142 mEq/L (ref 137–147)
TCO2: 18 mmol/L (ref 0–100)
TCO2: 20 mmol/L (ref 0–100)
TCO2: 21 mmol/L (ref 0–100)

## 2013-12-31 LAB — TROPONIN I
TROPONIN I: 2.66 ng/mL — AB (ref <0.30)
TROPONIN I: 3.01 ng/mL — AB (ref <0.30)
Troponin I: 2.92 ng/mL (ref <0.30)

## 2013-12-31 LAB — APTT: APTT: 28 s (ref 24–37)

## 2013-12-31 LAB — MRSA PCR SCREENING: MRSA BY PCR: NEGATIVE

## 2013-12-31 LAB — PROTIME-INR
INR: 1.46 (ref 0.00–1.49)
Prothrombin Time: 17.7 seconds — ABNORMAL HIGH (ref 11.6–15.2)

## 2013-12-31 LAB — MAGNESIUM: MAGNESIUM: 1.9 mg/dL (ref 1.5–2.5)

## 2013-12-31 MED ORDER — POTASSIUM CHLORIDE 10 MEQ/50ML IV SOLN
10.0000 meq | INTRAVENOUS | Status: AC
Start: 2013-12-31 — End: 2013-12-31
  Administered 2013-12-31 (×4): 10 meq via INTRAVENOUS
  Filled 2013-12-31: qty 50

## 2013-12-31 MED ORDER — POTASSIUM CHLORIDE 10 MEQ/50ML IV SOLN
10.0000 meq | INTRAVENOUS | Status: AC
  Administered 2013-12-31 (×4): 10 meq via INTRAVENOUS
  Filled 2013-12-31: qty 50

## 2013-12-31 MED ORDER — HEPARIN SODIUM (PORCINE) 5000 UNIT/ML IJ SOLN
5000.0000 [IU] | Freq: Three times a day (TID) | INTRAMUSCULAR | Status: DC
Start: 2013-12-31 — End: 2014-01-01
  Administered 2013-12-31 – 2014-01-01 (×3): 5000 [IU] via SUBCUTANEOUS
  Filled 2013-12-31 (×3): qty 1

## 2013-12-31 MED ORDER — SODIUM CHLORIDE 0.9 % IV SOLN
INTRAVENOUS | Status: DC
  Administered 2013-12-31: 18:00:00 via INTRAVENOUS
  Filled 2013-12-31 (×2): qty 2.5

## 2013-12-31 MED ORDER — POTASSIUM CHLORIDE 10 MEQ/50ML IV SOLN
INTRAVENOUS | Status: AC
  Filled 2013-12-31: qty 100

## 2013-12-31 MED ORDER — POTASSIUM CHLORIDE 10 MEQ/50ML IV SOLN
10.0000 meq | INTRAVENOUS | Status: AC
  Administered 2013-12-31 (×4): 10 meq via INTRAVENOUS
  Filled 2013-12-31 (×4): qty 50

## 2013-12-31 MED ORDER — NOREPINEPHRINE BITARTRATE 1 MG/ML IV SOLN
0.5000 ug/min | INTRAVENOUS | Status: DC
  Administered 2013-12-31: 30 ug/min via INTRAVENOUS
  Administered 2013-12-31: 40 ug/min via INTRAVENOUS
  Administered 2013-12-31 – 2014-01-01 (×2): 50 ug/min via INTRAVENOUS
  Administered 2014-01-01: 30 ug/min via INTRAVENOUS
  Administered 2014-01-01: 45 ug/min via INTRAVENOUS
  Administered 2014-01-01: 50 ug/min via INTRAVENOUS
  Filled 2013-12-31 (×8): qty 16

## 2013-12-31 MED ORDER — VASOPRESSIN 20 UNIT/ML IJ SOLN
0.0300 [IU]/min | INTRAVENOUS | Status: DC
  Administered 2013-12-31 – 2014-01-01 (×3): 0.03 [IU]/min via INTRAVENOUS
  Filled 2013-12-31 (×3): qty 2

## 2013-12-31 MED ORDER — SODIUM CHLORIDE 0.9 % IV SOLN
INTRAVENOUS | Status: DC
  Administered 2013-12-31: 2.6 [IU]/h via INTRAVENOUS
  Filled 2013-12-31 (×2): qty 2.5

## 2013-12-31 MED ORDER — DEXTROSE 50 % IV SOLN: 25.0000 mL | INTRAVENOUS | Status: DC | PRN

## 2013-12-31 MED ORDER — SODIUM CHLORIDE 0.9 % IV SOLN
INTRAVENOUS | Status: DC
  Administered 2013-12-31: 14:00:00 via INTRAVENOUS

## 2013-12-31 NOTE — Progress Notes (Addendum)
SUBJECTIVE: The patient was admitted with VF arrest and prolonged downtime.  He is presently being cooled according to our hypothermia protocol.  Ventricular arrhythmias are stable on IV lidocaine.   . sodium chloride  2,000 mL Intravenous Once  . antiseptic oral rinse  7 mL Mouth Rinse QID  . artificial tears  1 application Both Eyes 3 times per day  . aspirin  300 mg Rectal NOW  . atorvastatin  40 mg Oral QPM  . chlorhexidine  15 mL Mouth Rinse BID  . cisatracurium  0.1 mg/kg Intravenous Once  . famotidine (PEPCID) IV  20 mg Intravenous Q12H  . fentaNYL  100 mcg Intravenous Once  . heparin  5,000 Units Subcutaneous 3 times per day  . levothyroxine  50 mcg Oral QAC breakfast  . midazolam  2 mg Intravenous Once  . potassium chloride  10 mEq Intravenous Q1 Hr x 4  . potassium chloride       . cisatracurium (NIMBEX) infusion 1 mcg/kg/min (12/31/13 0800)  . fentaNYL infusion INTRAVENOUS 50 mcg/hr (12/31/13 0800)  . insulin (NOVOLIN-R) infusion 14 Units/hr (12/31/13 0915)  . lidocaine 2 mg/min (12/31/13 0800)  . midazolam (VERSED) infusion 2 mg/hr (12/31/13 0800)  . norepinephrine (LEVOPHED) Adult infusion 50 mcg/min (12/31/13 0853)  .  sodium bicarbonate  infusion 1000 mL 100 mL/hr at 12/31/13 0800  . vasopressin (PITRESSIN) infusion - *FOR SHOCK* 0.03 Units/min (12/31/13 0800)    OBJECTIVE: Physical Exam: Filed Vitals:   12/31/13 0755 12/31/13 0800 12/31/13 0830 12/31/13 0900  BP: 110/87 115/78 111/90 118/77  Pulse: 71 71 70 65  Temp:  91 F (32.8 C)  91 F (32.8 C)  TempSrc:  Core (Comment)  Core (Comment)  Resp: 30 30 30 30   Weight:      SpO2: 100% 100% 100% 100%    Intake/Output Summary (Last 24 hours) at 12/31/13 0934 Last data filed at 12/31/13 0800  Gross per 24 hour  Intake 2343.57 ml  Output    850 ml  Net 1493.57 ml    Telemetry reveals afib with PVCs and nonsustained VF  GEN- The patient is ill appearing, sedated on the vent Head- normocephalic,  atraumatic Eyes-  Unable to assess Ears- Unable to assess Oropharynx- ETT in place Neck- supple, no JVP Lungs- Clear to ausculation bilaterally, normal work of breathing Heart- irregular rate and rhythm,  GI- soft, ND, + BS Extremities- + dependant edema Skin- no rash or lesion Psych- Unable to assess Neuro-Unable to assess  LABS: Basic Metabolic Panel:  Recent Labs  01/24/2014 2125  12/31/13 0436 12/31/13 0705  NA 145  < > 141 141  K 4.3  < > 3.1* 3.3*  CL 110  < > 109 108  CO2 16*  --   --  17*  GLUCOSE 263*  < > 365* 341*  BUN 21  < > 29* 29*  CREATININE 1.10  < > 1.20 1.25  CALCIUM 7.6*  --   --  7.7*  < > = values in this interval not displayed. Liver Function Tests:  Recent Labs  01/19/2014 2125  AST 182*  ALT 226*  ALKPHOS 74  BILITOT <0.2*  PROT 3.5*  ALBUMIN 1.8*   No results found for this basename: LIPASE, AMYLASE,  in the last 72 hours CBC:  Recent Labs  01/17/2014 2125  12/31/13 0248 12/31/13 0436  WBC 8.3  --   --   --   NEUTROABS 1.0*  --   --   --  HGB 10.6*  < > 13.3 13.6  HCT 33.4*  < > 39.0 40.0  MCV 93.3  --   --   --   PLT 79*  --   --   --   < > = values in this interval not displayed. Cardiac Enzymes:  Recent Labs  12/31/13 0300  TROPONINI 2.92*    RADIOLOGY: Dg Chest Port 1 View  12/31/2013   CLINICAL DATA:  Central line placement  EXAM: PORTABLE CHEST - 1 VIEW  COMPARISON:  12/27/2013  FINDINGS: Endotracheal tube tip measures 5.6 cm above the carinal. Enteric tube tip is off the field of view but is in the upper abdomen consistent with location in the stomach. Interval placement of a right central venous catheter. The tip is over the upper SVC region. No pneumothorax. Developing atelectasis in the right upper lung of. Probable developing perihilar infiltrates. No blunting of costophrenic angles. No pneumothorax.  IMPRESSION: Appliances appear to be in satisfactory position. New development of atelectasis in the right upper lung and  probable developing perihilar infiltrates.   Electronically Signed   By: Lucienne Capers M.D.   On: 12/31/2013 00:51   Dg Chest Portable 1 View  01/07/2014   CLINICAL DATA:  Cardiac arrest.  Unresponsive.  CPR.  EXAM: PORTABLE CHEST - 1 VIEW  COMPARISON:  09/29/2012  FINDINGS: An endotracheal tube is been placed with tip measuring 4.6 cm above the carinal. Enteric tube tip is off the field of view but below the left hemidiaphragm. Shallow inspiration. Normal heart size and pulmonary vascularity. No focal airspace disease. No blunting of costophrenic angles. No pneumothorax.  IMPRESSION: Appliances appear in satisfactory location. Shallow inspiration. No active lung disease.   Electronically Signed   By: Lucienne Capers M.D.   On: 01/01/2014 22:02    ASSESSMENT AND PLAN:  Principal Problem:   Cardiac arrest Active Problems:   HYPERLIPIDEMIA   HYPERTENSION   CAD   Ventricular fibrillation   1. S/p VF cardiac arrest with prolonged down time Continue hypotheramia protocol On IV lidocaine and stable Could consider amiodarone if further sustained ventricular arrhythmias  2. CAD/ NSTEMI Would require further CV risk stratification with cath should he make meaningful recovery I agree with PCCM that presently he is too unstable for cath  3. HTN On the hypothermia protocol  4. Respiratory failure Per PCCM  This patient is critically ill and at significant risk of cardiovascular worsening, death and care requires constant monitoring of vital signs, hemodynamics,respiratory and cardiac monitoring,review of multiple databases, neurological assessment,   and medical decision making of high complexity. I spent 30 minutes of critical care time in the care of this patient.    Prognosis is very poor.  Will follow allow with you. Call with questions     Thompson Grayer, MD 12/31/2013 9:34 AM

## 2013-12-31 NOTE — Progress Notes (Signed)
CRITICAL VALUE ALERT  Critical value received:  Calcium 6.3  Date of notification:  12/31/2013  Time of notification: 13:26  Critical value read back:Yes.    Nurse who received alert:  Hadassah Pais RN BSN  MD notified (1st page):  Not called expected value  Time of first page:    MD notified (2nd page):  Time of second page:  Responding MD:    Time MD responded:

## 2013-12-31 NOTE — Procedures (Signed)
Central Venous Catheter Insertion Procedure Note Adrian Jensen [id number] [birth year]  Procedure: Insertion of Central Venous Catheter Indications: Assessment of intravascular volume, Drug and/or fluid administration and Frequent blood sampling  Procedure Details Consent: Risks of procedure as well as the alternatives and risks of each were explained to the (patient/caregiver).  Consent for procedure obtained. Time Out: Verified patient identification, verified procedure, site/side was marked, verified correct patient position, special equipment/implants available, medications/allergies/relevent history reviewed, required imaging and test results available.  Performed  Maximum sterile technique was used including antiseptics, cap, gloves, gown, hand hygiene, mask and sheet. Skin prep: Chlorhexidine; local anesthetic administered A antimicrobial bonded/coated triple lumen catheter was placed in the right internal jugular vein using the Seldinger technique.  Bedside ultrasound was used to guide placement.  Evaluation Blood flow good Complications: No apparent complications Patient did tolerate procedure well. Chest X-ray ordered to verify placement.  CXR: pending.  Thad Ranger 12/31/2013, 12:02 AM

## 2013-12-31 NOTE — Progress Notes (Addendum)
224mls of fentanyl and 86mls of versed wasted in the presence of Loza Prell Investment banker, corporate)  Verified with Raj Janus, Rn

## 2013-12-31 NOTE — Progress Notes (Addendum)
PULMONARY / CRITICAL CARE MEDICINE HISTORY AND PHYSICAL EXAMINATION   Name: Adrian Jensen MRN: [id number] DOB: 11-30-1932    ADMISSION DATE:  12/29/2013  PRIMARY SERVICE: PCCM  CHIEF COMPLAINT:  Cardiac arrest  BRIEF PATIENT DESCRIPTION:  80 yo with CAD admitted after VF arrest and ROSC after about 40 minutes of ACLS.  SIGNIFICANT EVENTS / STUDIES:  9/4 VF arrest and ROSC after about 40 minutes of ACLS, hypothermia began  INTERVAL HISTORY: Difficulty reaching goal MAP overnight   VITAL SIGNS: Temp:  [90 F (32.2 C)-93 F (33.9 C)] 91.8 F (33.2 C) (09/05 1200) Pulse Rate:  [45-99] 65 (09/05 1200) Resp:  [12-30] 30 (09/05 1200) BP: (54-156)/(37-103) 135/103 mmHg (09/05 1200) SpO2:  [60 %-100 %] 99 % (09/05 1200) Arterial Line BP: (58-118)/(45-96) 106/79 mmHg (09/05 1200) FiO2 (%):  [100 %] 100 % (09/05 1145) Weight:  [83.6 kg (184 lb 4.9 oz)-93.1 kg (205 lb 4 oz)] 93.1 kg (205 lb 4 oz) (09/04 2220)  HEMODYNAMICS: CVP:  [10 mmHg-13 mmHg] 13 mmHg  VENTILATOR SETTINGS: Vent Mode:  [-] PRVC FiO2 (%):  [100 %] 100 % Set Rate:  [24 bmp-30 bmp] 30 bmp Vt Set:  [550 mL-620 mL] 550 mL PEEP:  [5 cmH20-10 cmH20] 10 cmH20 Plateau Pressure:  [20 cmH20-27 cmH20] 21 cmH20  INTAKE / OUTPUT: Intake/Output     09/04 0701 - 09/05 0700 09/05 0701 - 09/06 0700   I.V. (mL/kg) 1778.4 (19.1) 1136.8 (12.2)   Other  30   IV Piggyback 300 200   Total Intake(mL/kg) 2078.4 (22.3) 1366.8 (14.7)   Urine (mL/kg/hr) 350 70 (0.1)   Emesis/NG output 500    Total Output 850 70   Net +1228.4 +1296.8         PHYSICAL EXAMINATION: General:  Appears acutely ill, mechanically ventilated, synchronous Neuro:  Encephalopathic, nonfocal, cough / gag absent HEENT:  PERRL, OETT / OGT Cardiovascular:  RRR, no m/r/g Lungs:  Bilateral rhonchi, rales Abdomen:  Soft, nontender, bowel sounds diminished Musculoskeletal:  Trace edema Skin:  Intact  LABS:  CBC  Recent Labs Lab 12/28/2013 2125   12/31/13 0248 12/31/13 0436 12/31/13 0900  WBC 8.3  --   --   --  9.2  HGB 10.6*  < > 13.3 13.6 10.4*  HCT 33.4*  < > 39.0 40.0 31.3*  PLT 79*  --   --   --  40*  < > = values in this interval not displayed. Coag's  Recent Labs Lab 01/16/2014 2125 12/31/13 0557  APTT  --  28  INR 1.77* 1.46   BMET  Recent Labs Lab 01/22/2014 2125  12/31/13 0248 12/31/13 0436 12/31/13 0705  NA 145  < > 142 141 141  K 4.3  < > 2.9* 3.1* 3.3*  CL 110  < > 111 109 108  CO2 16*  --   --   --  17*  BUN 21  < > 22 29* 29*  CREATININE 1.10  < > 1.10 1.20 1.25  GLUCOSE 263*  < > 383* 365* 341*  < > = values in this interval not displayed. Electrolytes  Recent Labs Lab 01/12/2014 2125 12/31/13 0705  CALCIUM 7.6* 7.7*   Sepsis Markers  Recent Labs Lab 01/23/2014 2129  LATICACIDVEN 11.86*   ABG  Recent Labs Lab 12/31/13 0027 12/31/13 0141 12/31/13 1148  PHART 7.288* 7.326* 7.297*  PCO2ART 42.1 36.8 33.0*  PO2ART 44.0* 43.1* 82.0   Liver Enzymes  Recent Labs Lab 01/12/2014 2125  AST  182*  ALT 226*  ALKPHOS 74  BILITOT <0.2*  ALBUMIN 1.8*   Cardiac Enzymes  Recent Labs Lab 12/31/13 0300 12/31/13 0915  TROPONINI 2.92* 2.66*   Glucose  Recent Labs Lab 12/31/13 0024 12/31/13 0132 12/31/13 0309 12/31/13 0410 12/31/13 0515 12/31/13 0616  GLUCAP 315* 323* 334* 319* 297* 294*   IMAGING:  Dg Chest Port 1 View  12/31/2013   CLINICAL DATA:  Central line placement  EXAM: PORTABLE CHEST - 1 VIEW  COMPARISON:  01/08/2014  FINDINGS: Endotracheal tube tip measures 5.6 cm above the carinal. Enteric tube tip is off the field of view but is in the upper abdomen consistent with location in the stomach. Interval placement of a right central venous catheter. The tip is over the upper SVC region. No pneumothorax. Developing atelectasis in the right upper lung of. Probable developing perihilar infiltrates. No blunting of costophrenic angles. No pneumothorax.  IMPRESSION: Appliances  appear to be in satisfactory position. New development of atelectasis in the right upper lung and probable developing perihilar infiltrates.   Electronically Signed   By: Lucienne Capers M.D.   On: 12/31/2013 00:51   Dg Chest Portable 1 View  01/17/2014   CLINICAL DATA:  Cardiac arrest.  Unresponsive.  CPR.  EXAM: PORTABLE CHEST - 1 VIEW  COMPARISON:  09/29/2012  FINDINGS: An endotracheal tube is been placed with tip measuring 4.6 cm above the carinal. Enteric tube tip is off the field of view but below the left hemidiaphragm. Shallow inspiration. Normal heart size and pulmonary vascularity. No focal airspace disease. No blunting of costophrenic angles. No pneumothorax.  IMPRESSION: Appliances appear in satisfactory location. Shallow inspiration. No active lung disease.   Electronically Signed   By: Lucienne Capers M.D.   On: 01/19/2014 22:02   ASSESSMENT / PLAN: PULMONARY A: Acute hypercarbic / hypoxemic respiratory failure, intubated 9/4 Acute pulmonary edema Possible aspiration pneumonia / pneumonitis  P:   Goal pH>7.30, SpO2>92 Continuous mechanical support VAP bundle Defer SBT while on Nimbex Trend ABG/CXR Albuterol / Atrovent PRN  CARDIOVASCULAR A: VF cardiac arrest, downtime 40 minutes before ROSC Acute systolic congestive heart failure Cardiogenic shock Demand ischemia vs NSTEMI P:   Cardiology following Goal MAP>85 Levophed gtt Vasopressin gtt Lidocaine gtt Cont [place] gtt ASA Heparin contraindicated - severe thrombocytopenia  Statin contraindicated -  BB / ACEI contraindicated - shock  RENAL A: Lactic acidosis P:   Trend BMP Bicarbonate gtt Fluids per Hypothermia protocol  GASTROINTESTINAL A: Nutrition GI Px P:   NPO TF once off Nimbex Pepcid  HEMATOLOGIC A: Anemia Thrombocytopenia VTE Px P:   Trend CBC Heparin [place]  INFECTIOUS A: No acute issues P:   Monitor for signs of infection  ENDOCRINE A: Hyperglycemia P:    SSI  NEUROLOGIC A: Intubated, sedated, paralyzed  P:   Goal RASS -4 to -5 Fentanyl / Versed / Nimbex Defer WUA  I have personally obtained history, examined patient, evaluated and interpreted laboratory and imaging results, reviewed medical records, formulated assessment / plan and placed orders.  CRITICAL CARE:  The patient is critically ill with multiple organ systems failure and requires high complexity decision making for assessment and support, frequent evaluation and titration of therapies, application of advanced monitoring technologies and extensive interpretation of multiple databases. Critical Care Time devoted to patient care services described in this note is 40 minutes.   Doree Fudge, MD [facility]monary [facility] [place] Pager: [phone]  12/31/2013, 12:47 PM

## 2013-12-31 NOTE — Progress Notes (Signed)
ANTICOAGULATION CONSULT NOTE - Initial Consult  Pharmacy Consult for heparin Indication: cardiac arrest/NSTEMI  No Known Allergies  Patient Measurements: Weight: 205 lb 4 oz (93.1 kg) (with artic sun pads filled with water) Heparin Dosing Weight: 84 kg  Vital Signs: Temp: 91.8 F (33.2 C) (09/05 1200) Temp src: Core (Comment) (09/05 1200) BP: 141/106 mmHg (09/05 1230) Pulse Rate: 66 (09/05 1245)  Labs:  Recent Labs  01/01/2014 2125  12/31/13 0248 12/31/13 0300 12/31/13 0436 12/31/13 0557 12/31/13 0705 12/31/13 0900 12/31/13 0915  HGB 10.6*  < > 13.3  --  13.6  --   --  10.4*  --   HCT 33.4*  < > 39.0  --  40.0  --   --  31.3*  --   PLT 79*  --   --   --   --   --   --  40*  --   APTT  --   --   --   --   --  28  --   --   --   LABPROT 20.6*  --   --   --   --  17.7*  --   --   --   INR 1.77*  --   --   --   --  1.46  --   --   --   CREATININE 1.10  < > 1.10  --  1.20  --  1.25  --   --   TROPONINI  --   --   --  2.92*  --   --   --   --  2.66*  < > = values in this interval not displayed.  The CrCl is unknown because both a height and weight (above a minimum accepted value) are required for this calculation.   Medical History: Past Medical History  Diagnosis Date  . Coronary artery disease   . Hypertension   . Hyperlipidemia   . GERD (gastroesophageal reflux disease)   . Hypothyroidism   . Personal history of malignant neoplasm of bladder   . Asthma   . Myocardial infarction     2009 WITH STENTS    Medications:  Scheduled:  . sodium chloride  2,000 mL Intravenous Once  . antiseptic oral rinse  7 mL Mouth Rinse QID  . artificial tears  1 application Both Eyes 3 times per day  . aspirin  300 mg Rectal NOW  . chlorhexidine  15 mL Mouth Rinse BID  . cisatracurium  0.1 mg/kg Intravenous Once  . famotidine (PEPCID) IV  20 mg Intravenous Q12H  . fentaNYL  100 mcg Intravenous Once  . levothyroxine  50 mcg Oral QAC breakfast  . midazolam  2 mg Intravenous Once    Infusions:  . cisatracurium (NIMBEX) infusion 1 mcg/kg/min (12/31/13 0800)  . fentaNYL infusion INTRAVENOUS 50 mcg/hr (12/31/13 0800)  . insulin (NOVOLIN-R) infusion 30.7 Units/hr (12/31/13 1211)  . lidocaine 2 mg/min (12/31/13 1249)  . midazolam (VERSED) infusion 2 mg/hr (12/31/13 0800)  . norepinephrine (LEVOPHED) Adult infusion 40 mcg/min (12/31/13 1253)  .  sodium bicarbonate  infusion 1000 mL 100 mL/hr at 12/31/13 0800  . vasopressin (PITRESSIN) infusion - *FOR SHOCK* 0.03 Units/min (12/31/13 0800)    Assessment: 80 yo m admitted on 9/4 s/p vfib cardiac arrest with ROSC after ~40 minutes of ACLS. Hypothermia protocol initiated. Troponin elevated since admission (2.92, 2.66).  Patient not a candidate for cath at this time d/t hemodynamic instability. Pharmacy is consulted to start  heparin for possible NSTEMI. Hgb 10.4 (was 13.6), platelets 40.  Paged Dr. Oliver Pila regarding platelets.  Continue SQ heparin for now and watch platelets. No heparin drip at this time.  Plan:  Hold off on heparin drip Continue SQ heparin at this time Monitor hgb/hct, platelets, clinical course  Myna Freimark L. Nicole Kindred, PharmD Clinical Pharmacy Resident Pager: [phone] 12/31/2013 1:20 PM

## 2013-12-31 NOTE — Progress Notes (Signed)
CRITICAL VALUE ALERT  Critical value received:  K 2.7; Calcium 6.4  Date of notification:  12/31/2013  Time of notification:  1634  Critical value read back:Yes.    Nurse who received alert:  Hadassah Pais RN BSN  MD notified (1st page):  Dr. Halford Chessman  Time of first page:  1636  MD notified (2nd page):  Time of second page:  Responding MD: Dr. Halford Chessman  Time MD responded:  [phone]

## 2013-12-31 NOTE — Procedures (Signed)
Arterial Catheter Insertion Procedure Note Adrian Jensen [id number] August 16, 1932  Procedure: Insertion of Arterial Catheter  Indications: Blood pressure monitoring  Procedure Details Consent: Unable to obtain consent because of emergent medical necessity. Time Out: Verified patient identification, verified procedure, site/side was marked, verified correct patient position, special equipment/implants available, medications/allergies/relevent history reviewed, required imaging and test results available.  Performed  Maximum sterile technique was used including antiseptics, cap, gloves, gown, hand hygiene, mask and sheet. Skin prep: Chlorhexidine; local anesthetic administered 20 gauge catheter was inserted into right radial artery using the Seldinger technique.  Evaluation Blood flow good; BP tracing good. Complications: No apparent complications  Good waveform. No complications noted. [name] 12/31/2013

## 2013-12-31 NOTE — Progress Notes (Signed)
ABG    Component Value Date/Time   PHART 7.326* 12/31/2013 0141   PCO2ART 36.8 12/31/2013 0141   PO2ART 43.1* 12/31/2013 0141   HCO3 19.9* 12/31/2013 0141   TCO2 21.2 12/31/2013 0141   ACIDBASEDEF 5.8* 12/31/2013 0141   O2SAT 83.2 12/31/2013 0141    100%Fi02, RR30, Vt550, +10

## 2013-12-31 NOTE — Progress Notes (Signed)
critCRITICAL VALUE ALERT  Critical value received: 2.92  Date of notification:  12/31/2013  Time of notification:  0345  Critical value read back. yes  Nurse who received alert:  Purcell Nails  MD notified (1st page):    Time of first page:  [phone]  MD notified (2nd page):  Time of second page:  Responding MD:  Jules Husbands  Time MD responded:  [phone]

## 2013-12-31 NOTE — Progress Notes (Signed)
[place] Progress Note Patient Name: Adrian Jensen DOB: 10/21/1932 MRN: [id number]   Date of Service  12/31/2013  HPI/Events of Note  Low K.  eICU Interventions  Will give KCL IV x 4 runs.  F/u BMET and Mg.     Intervention Category Major Interventions: Other:  Diyan Dave 12/31/2013, 4:39 PM

## 2013-12-31 NOTE — Progress Notes (Signed)
INITIAL NUTRITION ASSESSMENT  DOCUMENTATION CODES Per approved criteria  -Obesity Unspecified   INTERVENTION: When pt is fully rewarmed: Initiate Vital High Protein @ 23ml/hr via OGT and increase by 10 ml every 4 hours to goal rate of 50 ml/hr.   30 ml Prostat  BID (200 kcal, 30 gr protein).    Tube feeding regimen provides 1400 kcal (100% of needs), 130 grams of protein, and  960 ml of H2O.    NUTRITION DIAGNOSIS: Inadequate oral intake related to inability to eat  as evidenced by NPO status  .   Goal: Enteral nutrition to provide 60-70% of estimated calorie needs (22-25 kcals/kg ideal body weight) and 100% of estimated protein needs, based on ASPEN guidelines for hypocaloric high protein feeding in critically ill obese individuals  Monitor:  Vent status, diet advancement, labs and weights  Reason for Assessment: consult for tube feeding recommendations  80 y.o. male  Admitting Dx: Cardiac arrest  ASSESSMENT: Patient is currently intubated on ventilator support. Vfib arrest and ROSC after CPR administered prior to admission. Hx includes CAD, HTN and dyslipidemia. Artic Sun protocol.  MV: 16   L/min Temp (24hrs), Avg:91.6 F (33.1 C), Min:90 F (32.2 C), Max:93 F (33.9 C)  Height: Ht Readings from Last 1 Encounters:  12/20/13 5\' 9"  (1.753 m)    Weight: Wt Readings from Last 1 Encounters:  01/12/2014 205 lb 4 oz (93.1 kg)    Ideal Body Weight: 160# (72.7 kg)  % Ideal Body Weight: 128%  Wt Readings from Last 10 Encounters:  12/31/2013 205 lb 4 oz (93.1 kg)  12/20/13 184 lb 6.4 oz (83.643 kg)  08/15/13 184 lb (83.462 kg)  07/15/13 186 lb (84.369 kg)  02/07/13 187 lb (84.823 kg)  11/09/12 186 lb (84.369 kg)  10/19/12 184 lb (83.462 kg)  10/19/12 184 lb (83.462 kg)  10/11/12 187 lb (84.823 kg)  10/01/12 186 lb 12.8 oz (84.732 kg)    Usual Body Weight: 185#  % Usual Body Weight: 111%  BMI:  Body mass index is 30.3 kg/(m^2). obesity class I  Estimated  Nutritional Needs: Kcal: 2186   (1311-1530 kcal ) Protein: 125-149 gr Fluid: 2200 ml daily  Skin: No issues noted   Diet Order: NPO  EDUCATION NEEDS: -No education needs identified at this time   Intake/Output Summary (Last 24 hours) at 12/31/13 0928 Last data filed at 12/31/13 0800  Gross per 24 hour  Intake 2343.57 ml  Output    850 ml  Net 1493.57 ml    Last BM: 9/4  Labs:   Recent Labs Lab 01/19/2014 2125  12/31/13 0248 12/31/13 0436 12/31/13 0705  NA 145  < > 142 141 141  K 4.3  < > 2.9* 3.1* 3.3*  CL 110  < > 111 109 108  CO2 16*  --   --   --  17*  BUN 21  < > 22 29* 29*  CREATININE 1.10  < > 1.10 1.20 1.25  CALCIUM 7.6*  --   --   --  7.7*  GLUCOSE 263*  < > 383* 365* 341*  < > = values in this interval not displayed.  CBG (last 3)   Recent Labs  12/31/13 0410 12/31/13 0515 12/31/13 0616  GLUCAP 319* 297* 294*    Scheduled Meds: . sodium chloride  2,000 mL Intravenous Once  . antiseptic oral rinse  7 mL Mouth Rinse QID  . artificial tears  1 application Both Eyes 3 times per day  .  aspirin  300 mg Rectal NOW  . atorvastatin  40 mg Oral QPM  . chlorhexidine  15 mL Mouth Rinse BID  . cisatracurium  0.1 mg/kg Intravenous Once  . famotidine (PEPCID) IV  20 mg Intravenous Q12H  . fentaNYL  100 mcg Intravenous Once  . heparin  5,000 Units Subcutaneous 3 times per day  . levothyroxine  50 mcg Oral QAC breakfast  . midazolam  2 mg Intravenous Once  . potassium chloride  10 mEq Intravenous Q1 Hr x 4  . potassium chloride        Continuous Infusions: . cisatracurium (NIMBEX) infusion 1 mcg/kg/min (12/31/13 0800)  . fentaNYL infusion INTRAVENOUS 50 mcg/hr (12/31/13 0800)  . insulin (NOVOLIN-R) infusion 14 Units/hr (12/31/13 0915)  . lidocaine 2 mg/min (12/31/13 0800)  . midazolam (VERSED) infusion 2 mg/hr (12/31/13 0800)  . norepinephrine (LEVOPHED) Adult infusion 50 mcg/min (12/31/13 0853)  .  sodium bicarbonate  infusion 1000 mL 100 mL/hr at  12/31/13 0800  . vasopressin (PITRESSIN) infusion - *FOR SHOCK* 0.03 Units/min (12/31/13 0800)    Past Medical History  Diagnosis Date  . Coronary artery disease   . Hypertension   . Hyperlipidemia   . GERD (gastroesophageal reflux disease)   . Hypothyroidism   . Personal history of malignant neoplasm of bladder   . Asthma   . Myocardial infarction     2009 WITH STENTS    Past Surgical History  Procedure Laterality Date  . Coronary stent placement      of the right coronary artery and left anterior descending  . Bladder surgery    . Liver biopsy    . Ankle surgery      left  . Cystourethroscopy    . Tonsillectomy    . Colonoscopy      Colman Cater MS,RD,CSG,LDN Office: #[phone] Pager: [phone]

## 2014-01-01 ENCOUNTER — Inpatient Hospital Stay (HOSPITAL_COMMUNITY): Payer: Medicare Other

## 2014-01-01 LAB — POCT I-STAT 3, VENOUS BLOOD GAS (G3P V)
Acid-base deficit: 9 mmol/L — ABNORMAL HIGH (ref 0.0–2.0)
Bicarbonate: 18.4 mEq/L — ABNORMAL LOW (ref 20.0–24.0)
O2 SAT: 46 %
PCO2 VEN: 44.9 mmHg — AB (ref 45.0–50.0)
PH VEN: 7.223 — AB (ref 7.250–7.300)
PO2 VEN: 31 mmHg (ref 30.0–45.0)
Patient temperature: 37.6
TCO2: 20 mmol/L (ref 0–100)

## 2014-01-01 LAB — BASIC METABOLIC PANEL
ANION GAP: 16 — AB (ref 5–15)
Anion gap: 13 (ref 5–15)
BUN: 32 mg/dL — ABNORMAL HIGH (ref 6–23)
BUN: 32 mg/dL — ABNORMAL HIGH (ref 6–23)
CHLORIDE: 104 meq/L (ref 96–112)
CO2: 23 meq/L (ref 19–32)
CO2: 21 meq/L (ref 19–32)
Calcium: 6.3 mg/dL — CL (ref 8.4–10.5)
Calcium: 6.7 mg/dL — ABNORMAL LOW (ref 8.4–10.5)
Chloride: 103 mEq/L (ref 96–112)
Creatinine, Ser: 1.26 mg/dL (ref 0.50–1.35)
Creatinine, Ser: 1.39 mg/dL — ABNORMAL HIGH (ref 0.50–1.35)
GFR calc Af Amer: 61 mL/min — ABNORMAL LOW (ref >90)
GFR calc Af Amer: 54 mL/min — ABNORMAL LOW (ref >90)
GFR calc non Af Amer: 52 mL/min — ABNORMAL LOW (ref >90)
GFR calc non Af Amer: 47 mL/min — ABNORMAL LOW (ref >90)
GLUCOSE: 314 mg/dL — AB (ref 70–99)
GLUCOSE: 206 mg/dL — AB (ref 70–99)
POTASSIUM: 3.7 meq/L (ref 3.7–5.3)
POTASSIUM: 3.6 meq/L — AB (ref 3.7–5.3)
SODIUM: 141 meq/L (ref 137–147)
Sodium: 139 mEq/L (ref 137–147)

## 2014-01-01 LAB — GLUCOSE, CAPILLARY
GLUCOSE-CAPILLARY: 308 mg/dL — AB (ref 70–99)
GLUCOSE-CAPILLARY: 174 mg/dL — AB (ref 70–99)
GLUCOSE-CAPILLARY: 181 mg/dL — AB (ref 70–99)
GLUCOSE-CAPILLARY: 181 mg/dL — AB (ref 70–99)
GLUCOSE-CAPILLARY: 168 mg/dL — AB (ref 70–99)
GLUCOSE-CAPILLARY: 154 mg/dL — AB (ref 70–99)
Glucose-Capillary: 104 mg/dL — ABNORMAL HIGH (ref 70–99)
Glucose-Capillary: 107 mg/dL — ABNORMAL HIGH (ref 70–99)
Glucose-Capillary: 125 mg/dL — ABNORMAL HIGH (ref 70–99)
Glucose-Capillary: 131 mg/dL — ABNORMAL HIGH (ref 70–99)
Glucose-Capillary: 137 mg/dL — ABNORMAL HIGH (ref 70–99)
Glucose-Capillary: 151 mg/dL — ABNORMAL HIGH (ref 70–99)
Glucose-Capillary: 159 mg/dL — ABNORMAL HIGH (ref 70–99)
Glucose-Capillary: 181 mg/dL — ABNORMAL HIGH (ref 70–99)
Glucose-Capillary: 208 mg/dL — ABNORMAL HIGH (ref 70–99)
Glucose-Capillary: 236 mg/dL — ABNORMAL HIGH (ref 70–99)
Glucose-Capillary: 246 mg/dL — ABNORMAL HIGH (ref 70–99)

## 2014-01-01 LAB — CBC
HEMATOCRIT: 40.5 % (ref 39.0–52.0)
HEMOGLOBIN: 13.6 g/dL (ref 13.0–17.0)
MCH: 29.2 pg (ref 26.0–34.0)
MCHC: 33.6 g/dL (ref 30.0–36.0)
MCV: 86.9 fL (ref 78.0–100.0)
Platelets: 93 10*3/uL — ABNORMAL LOW (ref 150–400)
RBC: 4.66 MIL/uL (ref 4.22–5.81)
RDW: 14.3 % (ref 11.5–15.5)
WBC: 7.8 10*3/uL (ref 4.0–10.5)

## 2014-01-01 LAB — MAGNESIUM: MAGNESIUM: 1.8 mg/dL (ref 1.5–2.5)

## 2014-01-01 LAB — HEPARIN LEVEL (UNFRACTIONATED)
Heparin Unfractionated: 0.88 IU/mL — ABNORMAL HIGH (ref 0.30–0.70)
Heparin Unfractionated: 0.84 IU/mL — ABNORMAL HIGH (ref 0.30–0.70)

## 2014-01-01 MED ORDER — SODIUM BICARBONATE 8.4 % IV SOLN
INTRAVENOUS | Status: DC
  Administered 2014-01-01: 17:00:00 via INTRAVENOUS
  Filled 2014-01-01 (×3): qty 150

## 2014-01-01 MED ORDER — DEXTROSE-NACL 5-0.45 % IV SOLN
INTRAVENOUS | Status: DC
  Administered 2014-01-01: 70 mL via INTRAVENOUS

## 2014-01-01 MED ORDER — SODIUM CHLORIDE 0.9 % IV SOLN
INTRAVENOUS | Status: DC
  Administered 2014-01-01: 05:00:00 via INTRAVENOUS

## 2014-01-01 MED ORDER — MAGNESIUM SULFATE 40 MG/ML IJ SOLN
2.0000 g | Freq: Once | INTRAMUSCULAR | Status: AC
  Administered 2014-01-01: 2 g via INTRAVENOUS
  Filled 2014-01-01: qty 50

## 2014-01-01 MED ORDER — SODIUM CHLORIDE 0.9 % IV BOLUS (SEPSIS)
500.0000 mL | Freq: Once | INTRAVENOUS | Status: AC
Start: 2014-01-01 — End: 2014-01-01
  Administered 2014-01-01: 500 mL via INTRAVENOUS

## 2014-01-01 MED ORDER — SODIUM BICARBONATE 8.4 % IV SOLN
INTRAVENOUS | Status: AC
  Filled 2014-01-01: qty 50

## 2014-01-01 MED ORDER — FUROSEMIDE 10 MG/ML IJ SOLN
60.0000 mg | Freq: Once | INTRAMUSCULAR | Status: AC
  Administered 2014-01-01: 60 mg via INTRAVENOUS
  Filled 2014-01-01: qty 6

## 2014-01-01 MED ORDER — POTASSIUM CHLORIDE 10 MEQ/50ML IV SOLN
10.0000 meq | INTRAVENOUS | Status: AC
  Administered 2014-01-01 (×2): 10 meq via INTRAVENOUS
  Filled 2014-01-01 (×2): qty 50

## 2014-01-01 MED ORDER — SODIUM BICARBONATE 8.4 % IV SOLN
50.0000 meq | Freq: Once | INTRAVENOUS | Status: AC
  Administered 2014-01-01: 50 meq via INTRAVENOUS

## 2014-01-01 MED ORDER — HEPARIN (PORCINE) IN NACL 100-0.45 UNIT/ML-% IJ SOLN
700.0000 [IU]/h | INTRAMUSCULAR | Status: DC
  Administered 2014-01-01 (×2): 850 [IU]/h via INTRAVENOUS
  Filled 2014-01-01: qty 250

## 2014-01-01 MED ORDER — ACETAMINOPHEN 160 MG/5ML PO SOLN
650.0000 mg | ORAL | Status: DC | PRN
  Administered 2014-01-01: 650 mg via ORAL
  Filled 2014-01-01: qty 20.3

## 2014-01-01 MED ORDER — HEPARIN BOLUS VIA INFUSION
2800.0000 [IU] | Freq: Once | INTRAVENOUS | Status: AC
  Administered 2014-01-01: 2800 [IU] via INTRAVENOUS
  Filled 2014-01-01: qty 2800

## 2014-01-01 MED ORDER — INSULIN ASPART 100 UNIT/ML ~~LOC~~ SOLN
0.0000 [IU] | SUBCUTANEOUS | Status: DC
  Administered 2014-01-01: 2 [IU] via SUBCUTANEOUS
  Administered 2014-01-01 (×2): 3 [IU] via SUBCUTANEOUS

## 2014-01-01 NOTE — Progress Notes (Signed)
ANTICOAGULATION CONSULT NOTE - Follow Up Consult  Pharmacy Consult for Heparin Indication: chest pain/ACS  No Known Allergies  Patient Measurements: Height: 5\' 10"  (177.8 cm) Weight: 210 lb 12.2 oz (95.6 kg) IBW/kg (Calculated) : 73 Heparin Dosing Weight: 92.6 kg  Vital Signs: Temp: 100.2 F (37.9 C) (09/06 1900) Temp src: Core (Comment) (09/06 1330) BP: 90/48 mmHg (09/06 1900) Pulse Rate: 105 (09/06 1900)  Labs:  Recent Labs  01/23/2014 2125  12/31/13 0300 12/31/13 0436 12/31/13 0557  12/31/13 0900 12/31/13 0915 12/31/13 1515 12/31/13 2146 01/01/14 0200 01/01/14 0524 01/01/14 1510 01/01/14 1900  HGB 10.6*  < >  --  13.6  --   --  10.4*  --   --   --   --  13.6  --   --   HCT 33.4*  < >  --  40.0  --   --  31.3*  --   --   --   --  40.5  --   --   PLT 79*  --   --   --   --   --  40*  --   --   --   --  93*  --   --   APTT  --   --   --   --  28  --   --   --   --   --   --   --   --   --   LABPROT 20.6*  --   --   --  17.7*  --   --   --   --   --   --   --   --   --   INR 1.77*  --   --   --  1.46  --   --   --   --   --   --   --   --   --   HEPARINUNFRC  --   --   --   --   --   --   --   --   --   --   --   --  0.88* 0.84*  CREATININE 1.10  < >  --  1.20  --   < >  --  1.05 1.12 1.39* 1.26 1.39*  --   --   TROPONINI  --   --  2.92*  --   --   --   --  2.66* 3.01*  --   --   --   --   --   < > = values in this interval not displayed.  Estimated Creatinine Clearance: 50 ml/min (by C-G formula based on Cr of 1.39).  Assessment: 80 yo m admitted on 9/4 after Vfib cardiac arrest. ROSC after approximately 40 mins. Hypothermia protocol. Heparin level 0.84 remains elevated. Plts 93. Too unstable for cath.  Goal of Therapy:  Heparin level 0.3-0.7 units/ml Monitor platelets by anticoagulation protocol: Yes   Plan:  Decrease IV heparin to 700 units/hr Recheck heparin level in 8hrs.  Eilene Ghazi Stillinger 01/01/2014,8:04 PM

## 2014-01-01 NOTE — Progress Notes (Signed)
2 RTS attempted aline assertion.  Neither were successful.  RT removed dressing and repositioned cath with no success.  Rt will continue to monitor.

## 2014-01-01 NOTE — Progress Notes (Signed)
Arctic sun rewarming rate settings verified and adjusted by two RNs due to rapid patient rewarming over set rewarming rate. Will continue to monitor closely.

## 2014-01-01 NOTE — Progress Notes (Signed)
PULMONARY / CRITICAL CARE MEDICINE HISTORY AND PHYSICAL EXAMINATION  Name: Adrian Jensen MRN: [id number] DOB: [birth year]    ADMISSION DATE:  01/13/2014  PRIMARY SERVICE: PCCM  CHIEF COMPLAINT:  Cardiac arrest  BRIEF PATIENT DESCRIPTION:  80 yo with CAD admitted after VF arrest and ROSC after about 40 minutes of ACLS.  SIGNIFICANT EVENTS / STUDIES:  9/4  VF arrest and ROSC after about 40 minutes of ACLS, hypothermia began. 9/5  Rewarmed, off paralytics  INTERVAL HISTORY: High dose vasopressors, rewarmed, Nimbex d/c'd  VITAL SIGNS: Temp:  [89.6 F (32 C)-96.8 F (36 C)] 96.8 F (36 C) (09/06 0940) Pulse Rate:  [56-111] 111 (09/06 1000) Resp:  [24-31] 30 (09/06 1015) BP: (64-156)/(49-106) 100/66 mmHg (09/06 1015) SpO2:  [92 %-100 %] 100 % (09/06 1000) Arterial Line BP: (67-116)/(48-96) 72/67 mmHg (09/06 1015) FiO2 (%):  [70 %-100 %] 70 % (09/06 0745) Weight:  [95.6 kg (210 lb 12.2 oz)] 95.6 kg (210 lb 12.2 oz) (09/06 0200)  HEMODYNAMICS: CVP:  [11 mmHg-16 mmHg] 14 mmHg  VENTILATOR SETTINGS: Vent Mode:  [-] PRVC FiO2 (%):  [70 %-100 %] 70 % Set Rate:  [30 bmp] 30 bmp Vt Set:  [550 mL] 550 mL PEEP:  [10 cmH20] 10 cmH20 Plateau Pressure:  [21 cmH20-27 cmH20] 23 cmH20  INTAKE / OUTPUT: Intake/Output     09/05 0701 - 09/06 0700 09/06 0701 - 09/07 0700   I.V. (mL/kg) 5464.7 (57.2) 446.3 (4.7)   Other 30    NG/GT 90    IV Piggyback 450 50   Total Intake(mL/kg) 6034.7 (63.1) 496.3 (5.2)   Urine (mL/kg/hr) 388 (0.2) 60 (0.2)   Emesis/NG output 250 (0.1)    Total Output 638 60   Net +5396.7 +436.3         PHYSICAL EXAMINATION: General:  No distress Neuro:  Well sedated, cough / gag diminished HEENT:  PERRL, OETT / OGT Cardiovascular:  Regular, tachycardic Lungs:  Bilateral rales Abdomen:  Soft, nontender, bowel sounds diminished Musculoskeletal:  Anasarca Skin:  Intact  LABS:  CBC  Recent Labs Lab 01/23/2014 2125  12/31/13 0436 12/31/13 0900 01/01/14 0524   WBC 8.3  --   --  9.2 7.8  HGB 10.6*  < > 13.6 10.4* 13.6  HCT 33.4*  < > 40.0 31.3* 40.5  PLT 79*  --   --  40* 93*  < > = values in this interval not displayed.  Coag's  Recent Labs Lab 01/21/2014 2125 12/31/13 0557  APTT  --  28  INR 1.77* 1.46   BMET  Recent Labs Lab 12/31/13 2146 01/01/14 0200 01/01/14 0524  NA 136* 139 141  K 4.3 3.7 3.6*  CL 103 103 104  CO2 18* 23 21  BUN 34* 32* 32*  CREATININE 1.39* 1.26 1.39*  GLUCOSE 281* 314* 206*   Electrolytes  Recent Labs Lab 12/31/13 2146 01/01/14 0200 01/01/14 0524  CALCIUM 6.8* 6.3* 6.7*  MG 1.9  --  1.8   Sepsis Markers  Recent Labs Lab 01/08/2014 2129  LATICACIDVEN 11.86*   ABG  Recent Labs Lab 12/31/13 0027 12/31/13 0141 12/31/13 1148  PHART 7.288* 7.326* 7.297*  PCO2ART 42.1 36.8 33.0*  PO2ART 44.0* 43.1* 82.0   Liver Enzymes  Recent Labs Lab 01/01/2014 2125  AST 182*  ALT 226*  ALKPHOS 74  BILITOT <0.2*  ALBUMIN 1.8*   Cardiac Enzymes  Recent Labs Lab 12/31/13 0300 12/31/13 0915 12/31/13 1515  TROPONINI 2.92* 2.66* 3.01*   Glucose  Recent  Labs Lab 01/01/14 0151 01/01/14 0250 01/01/14 0347 01/01/14 0457 01/01/14 0552 01/01/14 0704  GLUCAP 308* 174* 181* 181* 181* 168*   IMAGING:  Dg Chest Port 1 View  01/01/2014   CLINICAL DATA:  Respiratory failure  EXAM: PORTABLE CHEST - 1 VIEW  COMPARISON:  12/31/2013; 01/25/2014; 09/29/2013  FINDINGS: Grossly unchanged cardiac silhouette and mediastinal contours. Stable positioning of support apparatus. No pneumothorax. Pulmonary vasculature appears less distinct on present examination with cephalization of flow. Interval development of small layering bilateral effusions with associated worsening bilateral mid and lower lung opacities, right greater than left. Grossly unchanged bones.  IMPRESSION: 1.  Stable positioning of support apparatus.  No pneumothorax. 2. Worsening pulmonary edema, now with small layering right-sided effusion  worsening bibasilar opacities, right greater than left, likely atelectasis.   Electronically Signed   By: Sandi Mariscal M.D.   On: 01/01/2014 07:25   Dg Chest Port 1 View  12/31/2013   CLINICAL DATA:  Central line placement  EXAM: PORTABLE CHEST - 1 VIEW  COMPARISON:  12/27/2013  FINDINGS: Endotracheal tube tip measures 5.6 cm above the carinal. Enteric tube tip is off the field of view but is in the upper abdomen consistent with location in the stomach. Interval placement of a right central venous catheter. The tip is over the upper SVC region. No pneumothorax. Developing atelectasis in the right upper lung of. Probable developing perihilar infiltrates. No blunting of costophrenic angles. No pneumothorax.  IMPRESSION: Appliances appear to be in satisfactory position. New development of atelectasis in the right upper lung and probable developing perihilar infiltrates.   Electronically Signed   By: Lucienne Capers M.D.   On: 12/31/2013 00:51   Dg Chest Portable 1 View  01/09/2014   CLINICAL DATA:  Cardiac arrest.  Unresponsive.  CPR.  EXAM: PORTABLE CHEST - 1 VIEW  COMPARISON:  09/29/2012  FINDINGS: An endotracheal tube is been placed with tip measuring 4.6 cm above the carinal. Enteric tube tip is off the field of view but below the left hemidiaphragm. Shallow inspiration. Normal heart size and pulmonary vascularity. No focal airspace disease. No blunting of costophrenic angles. No pneumothorax.  IMPRESSION: Appliances appear in satisfactory location. Shallow inspiration. No active lung disease.   Electronically Signed   By: Lucienne Capers M.D.   On: 01/03/2014 22:02   ASSESSMENT / PLAN: PULMONARY A: Acute hypercarbic / hypoxemic respiratory failure, intubated 9/4 Acute pulmonary edema  Possible aspiration pneumonia / pneumonitis / ARDS P:   Goal pH>7.30, SpO2>92 Continuous mechanical support, decrease PEEP 5 and FiO2 50 VAP bundle SBT daily Trend ABG/CXR Albuterol / Atrovent  PRN  CARDIOVASCULAR A: VF cardiac arrest, downtime 40 minutes before ROSC Acute systolic congestive heart failure Cardiogenic shock exacerbated by sedation and high settings positive pressure ventilation Demand ischemia vs NSTEMI P:   Cardiology following Goal MAP>65 Levophed gtt Vasopressin gtt Lidocaine gtt ASA Heparin held initially for thrombocytopenia, will start now that platelets are improved Statin contraindicated - elevated transaminases BB / ACEI contraindicated - shock Decision on cath based on neurological outcome  RENAL A: AKI with oliguria Fluid overload with pulmonary edema Hypokalemia Hypomagnesemia P:   Trend BMP D/c Bicarbonate D/c D5 1/2 NS Lasix 60 x 1 K 10 x 2 Mg 2  GASTROINTESTINAL A: Nutrition GI Px P:   NPO Start TF Pepcid  HEMATOLOGIC A: Anemia Thrombocytopenia, improving Therapeutic heparinization for ACS P:   Trend CBC Heparin gtt D/c Heparin [place]  INFECTIOUS A: No acute issues P:  Monitor for signs of infection  ENDOCRINE A: Hyperglycemia Hypothyroidism P:   D/c Insulin gtt SSI Synthroid  NEUROLOGIC A: Intubated, sedated, paralyzed  P:   Goal RASS -4 to -5 Wean Fentanyl / Versed gtt Daily WUA  I have personally obtained history, examined patient, evaluated and interpreted laboratory and imaging results, reviewed medical records, formulated assessment / plan and placed orders.  CRITICAL CARE:  The patient is critically ill with multiple organ systems failure and requires high complexity decision making for assessment and support, frequent evaluation and titration of therapies, application of advanced monitoring technologies and extensive interpretation of multiple databases. Critical Care Time devoted to patient care services described in this note is 35 minutes.   Doree Fudge, MD [facility] [place] Pager: [phone]  01/01/2014, 10:36 AM

## 2014-01-01 NOTE — Progress Notes (Signed)
Arctic sun machine exchanged for different machine due to patient rewarming faster than set rate of 0.3 degrees celcius per hour. Arctic sun machine rewarming settings verified on new machine by two RNs, rewarming using automatic mode, rewarming set at 0.3 degrees celcius per hour to target temp of 37C. Will continue to assess and monitor patient closely.

## 2014-01-01 NOTE — Progress Notes (Signed)
ANTICOAGULATION CONSULT NOTE - Initial Consult  Pharmacy Consult for heparin Indication: chest pain/ACS  No Known Allergies  Patient Measurements: Height: 5\' 10"  (177.8 cm) Weight: 210 lb 12.2 oz (95.6 kg) IBW/kg (Calculated) : 73 Heparin Dosing Weight: 92.6 kg  Vital Signs: Temp: 96.8 F (36 C) (09/06 0940) Temp src: Core (Comment) (09/06 0940) BP: 100/66 mmHg (09/06 1015) Pulse Rate: 111 (09/06 1000)  Labs:  Recent Labs  01/06/2014 2125  12/31/13 0300 12/31/13 0436 12/31/13 0557  12/31/13 0900 12/31/13 0915 12/31/13 1515 12/31/13 2146 01/01/14 0200 01/01/14 0524  HGB 10.6*  < >  --  13.6  --   --  10.4*  --   --   --   --  13.6  HCT 33.4*  < >  --  40.0  --   --  31.3*  --   --   --   --  40.5  PLT 79*  --   --   --   --   --  40*  --   --   --   --  93*  APTT  --   --   --   --  28  --   --   --   --   --   --   --   LABPROT 20.6*  --   --   --  17.7*  --   --   --   --   --   --   --   INR 1.77*  --   --   --  1.46  --   --   --   --   --   --   --   CREATININE 1.10  < >  --  1.20  --   < >  --  1.05 1.12 1.39* 1.26 1.39*  TROPONINI  --   --  2.92*  --   --   --   --  2.66* 3.01*  --   --   --   < > = values in this interval not displayed.  Estimated Creatinine Clearance: 50 ml/min (by C-G formula based on Cr of 1.39).   Medical History: Past Medical History  Diagnosis Date  . Coronary artery disease   . Hypertension   . Hyperlipidemia   . GERD (gastroesophageal reflux disease)   . Hypothyroidism   . Personal history of malignant neoplasm of bladder   . Asthma   . Myocardial infarction     2009 WITH STENTS    Medications:  Scheduled:  . antiseptic oral rinse  7 mL Mouth Rinse QID  . artificial tears  1 application Both Eyes 3 times per day  . chlorhexidine  15 mL Mouth Rinse BID  . famotidine (PEPCID) IV  20 mg Intravenous Q12H  . fentaNYL  100 mcg Intravenous Once  . insulin aspart  0-15 Units Subcutaneous 6 times per day  . levothyroxine  50  mcg Oral QAC breakfast  . magnesium sulfate 1 - 4 g bolus IVPB  2 g Intravenous Once  . midazolam  2 mg Intravenous Once  . potassium chloride  10 mEq Intravenous Q1 Hr x 2   Infusions:  . fentaNYL infusion INTRAVENOUS 50 mcg/hr (01/01/14 1000)  . lidocaine 2 mg/min (01/01/14 1000)  . midazolam (VERSED) infusion 2 mg/hr (01/01/14 1000)  . norepinephrine (LEVOPHED) Adult infusion 50 mcg/min (01/01/14 1058)  . vasopressin (PITRESSIN) infusion - *FOR SHOCK* 0.03 Units/min (01/01/14 1000)    Assessment:  80 yo m admitted on 9/4 after vfib cardiac arrest.  Patient obtained ROSC after approximately 40 minutes.  Hypothermia protocol was initiated. Patient is on IV lidocaine for VT, but is too unstable for cath at this time.  Will possibly go to cath if patient becomes stable enough.  Troponin still elevated this AM (2.92-3.01). Patient was rewarming too quickly overnight, but has since received a new machine and appropriate rewarming rates. Pharmacy was consulted to begin heparin for possible ACS yesterday, but platelets were 40.  Platelets this AM are 93 and pharmacy was re-consulted to begin heparin.   Patient is currently rewarming. Heparin bolus at 30 units/kg (2800 units) and drip at 9 units/kg/hr (850 units/hr).  Will check a 4-hr HL since patient is currently rewarming to assure he is not clearing heparin too fast.    Goal of Therapy:  Heparin level 0.3-0.7 units/ml Monitor platelets by anticoagulation protocol: Yes   Plan:  Give heparin bolus 2800 units now Begin heparin drip at 850 units/hr 4-hr HL @ 1500 Monitor hgb/plts, s/s of bleeding, hypothermia protocol, temperature  Krystal Teachey L. Nicole Kindred, PharmD Clinical Pharmacy Resident Pager: [phone] 01/01/2014 11:23 AM

## 2014-01-01 NOTE — Progress Notes (Signed)
Monitoring blood glucose via capillary fingerstick temporarily while RT attempting to obtain new arterial line. No blood return from current arterial line after multiple attempts to troubleshoot line, right IJ CVC with sedation, analgesia, NMB, vasopressors infusing. Will continue to assess and monitor patient closely.

## 2014-01-01 NOTE — ED Provider Notes (Signed)
I saw and evaluated the patient, reviewed the resident's note and I agree with the findings and plan.   EKG Interpretation   Date/Time:  Friday December 30 2013 21:37:35 EDT Ventricular Rate:  94 PR Interval:  309 QRS Duration: 129 QT Interval:  383 QTC Calculation: 479 R Axis:   94 Text Interpretation:  Atrial fibrillation Nonspecific intraventricular  conduction delay Repol abnrm, severe global ischemia (LM/MVD)  inferior/lateral ischemia noted Confirmed by Zenia Resides  MD, Olanda Downie ([id number]) on  01/16/2014 10:00:18 PM       Leota Jacobsen, MD 01/01/14 [phone]

## 2014-01-01 NOTE — Progress Notes (Signed)
Dr. Alva Garnet with [facility] called, made aware of low urine output of 15-64mL/hr. Aware arterial line dampened, positional, very poor waveform with inaccurate BP readings and unable to now draw blood. RT notified for request for new arterial line. Dr. Alva Garnet also made aware of serum calcium 6.3, creatinine 1.26, elevated blood glucose levels continue. Orders received, will continue to assess and monitor patient closely.

## 2014-01-01 NOTE — Progress Notes (Signed)
[facility] Progress Note Patient Name: Adrian Jensen DOB: 1933/03/17 MRN: [id number]   Date of Service  01/01/2014  HPI/Events of Note  Hypotensive.  eICU Interventions  Will give 500 ml NS fluid bolus.     Intervention Category Major Interventions: Other:  Parminder Cupples 01/01/2014, 8:30 PM

## 2014-01-01 NOTE — Progress Notes (Signed)
UR Completed.  Evon Lopezperez Jane [phone] 01/01/2014  

## 2014-01-01 NOTE — Progress Notes (Signed)
SUBJECTIVE: The patient was admitted with VF arrest and prolonged downtime.  He is presently being cooled according to our hypothermia protocol.  Ventricular arrhythmias are stable on IV lidocaine.   Marland Kitchen antiseptic oral rinse  7 mL Mouth Rinse QID  . artificial tears  1 application Both Eyes 3 times per day  . chlorhexidine  15 mL Mouth Rinse BID  . famotidine (PEPCID) IV  20 mg Intravenous Q12H  . fentaNYL  100 mcg Intravenous Once  . heparin  2,800 Units Intravenous Once  . insulin aspart  0-15 Units Subcutaneous 6 times per day  . levothyroxine  50 mcg Oral QAC breakfast  . magnesium sulfate 1 - 4 g bolus IVPB  2 g Intravenous Once  . midazolam  2 mg Intravenous Once  . potassium chloride  10 mEq Intravenous Q1 Hr x 2   . fentaNYL infusion INTRAVENOUS 50 mcg/hr (01/01/14 1000)  . heparin    . lidocaine 2 mg/min (01/01/14 1000)  . midazolam (VERSED) infusion 2 mg/hr (01/01/14 1000)  . norepinephrine (LEVOPHED) Adult infusion 50 mcg/min (01/01/14 1058)  . vasopressin (PITRESSIN) infusion - *FOR SHOCK* 0.03 Units/min (01/01/14 1000)    OBJECTIVE: Physical Exam: Filed Vitals:   01/01/14 0940 01/01/14 0945 01/01/14 1000 01/01/14 1015  BP:  98/58 114/63 100/66  Pulse:  108 111   Temp: 96.8 F (36 C)     TempSrc: Core (Comment)     Resp:  30 30 30   Height:      Weight:      SpO2:  95% 100%     Intake/Output Summary (Last 24 hours) at 01/01/14 1107 Last data filed at 01/01/14 1000  Gross per 24 hour  Intake 5405.97 ml  Output    628 ml  Net 4777.97 ml    Telemetry reveals sinus tachycardia  GEN- The patient is ill appearing, sedated on the vent Head- normocephalic, atraumatic Eyes-  Unable to assess Ears- Unable to assess Oropharynx- ETT in place Neck- supple, no JVP Lungs- Clear to ausculation bilaterally, normal work of breathing Heart- regular rate and rhythm,  GI- soft, ND, + BS Extremities- + dependant edema Skin- no rash or lesion Psych- Unable to  assess Neuro-Unable to assess  LABS: Basic Metabolic Panel:  Recent Labs  12/31/13 2146 01/01/14 0200 01/01/14 0524  NA 136* 139 141  K 4.3 3.7 3.6*  CL 103 103 104  CO2 18* 23 21  GLUCOSE 281* 314* 206*  BUN 34* 32* 32*  CREATININE 1.39* 1.26 1.39*  CALCIUM 6.8* 6.3* 6.7*  MG 1.9  --  1.8   Liver Function Tests:  Recent Labs  01/24/2014 2125  AST 182*  ALT 226*  ALKPHOS 74  BILITOT <0.2*  PROT 3.5*  ALBUMIN 1.8*   No results found for this basename: LIPASE, AMYLASE,  in the last 72 hours CBC:  Recent Labs  01/06/2014 2125  12/31/13 0900 01/01/14 0524  WBC 8.3  --  9.2 7.8  NEUTROABS 1.0*  --  5.4  --   HGB 10.6*  < > 10.4* 13.6  HCT 33.4*  < > 31.3* 40.5  MCV 93.3  --  88.2 86.9  PLT 79*  --  40* 93*  < > = values in this interval not displayed. Cardiac Enzymes:  Recent Labs  12/31/13 0300 12/31/13 0915 12/31/13 1515  TROPONINI 2.92* 2.66* 3.01*    RADIOLOGY: Dg Chest Port 1 View  12/31/2013   CLINICAL DATA:  Central line placement  EXAM: PORTABLE  CHEST - 1 VIEW  COMPARISON:  01/21/2014  FINDINGS: Endotracheal tube tip measures 5.6 cm above the carinal. Enteric tube tip is off the field of view but is in the upper abdomen consistent with location in the stomach. Interval placement of a right central venous catheter. The tip is over the upper SVC region. No pneumothorax. Developing atelectasis in the right upper lung of. Probable developing perihilar infiltrates. No blunting of costophrenic angles. No pneumothorax.  IMPRESSION: Appliances appear to be in satisfactory position. New development of atelectasis in the right upper lung and probable developing perihilar infiltrates.   Electronically Signed   By: Lucienne Capers M.D.   On: 12/31/2013 00:51   Dg Chest Portable 1 View  01/09/2014   CLINICAL DATA:  Cardiac arrest.  Unresponsive.  CPR.  EXAM: PORTABLE CHEST - 1 VIEW  COMPARISON:  09/29/2012  FINDINGS: An endotracheal tube is been placed with tip  measuring 4.6 cm above the carinal. Enteric tube tip is off the field of view but below the left hemidiaphragm. Shallow inspiration. Normal heart size and pulmonary vascularity. No focal airspace disease. No blunting of costophrenic angles. No pneumothorax.  IMPRESSION: Appliances appear in satisfactory location. Shallow inspiration. No active lung disease.   Electronically Signed   By: Lucienne Capers M.D.   On: 01/07/2014 22:02    ASSESSMENT AND PLAN:  Principal Problem:   Cardiac arrest Active Problems:   HYPERLIPIDEMIA   HYPERTENSION   CAD   Ventricular fibrillation   1. S/p VF cardiac arrest with prolonged down time Continue hypotheramia protocol On IV lidocaine and stable Could consider amiodarone if further sustained ventricular arrhythmias  2. CAD/ NSTEMI Would require further CV risk stratification with cath should he make meaningful recovery I agree with PCCM that presently he is too unstable for cath Start heparin drip now that platelets are improved  3. HTN On the hypothermia protocol  4. Respiratory failure Per PCCM  5. afib Now in sinus  This patient is critically ill and at significant risk of cardiovascular worsening, death and care requires constant monitoring of vital signs, hemodynamics,respiratory and cardiac monitoring,review of multiple databases, neurological assessment, discussion with PCCM  and medical decision making of high complexity. I spent 30 minutes of critical care time in the care of this patient.   Prognosis is very poor.  Will follow allow with you. Call with questions   Thompson Grayer, MD 01/01/2014 11:07 AM

## 2014-01-02 ENCOUNTER — Inpatient Hospital Stay (HOSPITAL_COMMUNITY): Payer: Medicare Other

## 2014-01-02 DIAGNOSIS — I469 Cardiac arrest, cause unspecified: Secondary | ICD-10-CM

## 2014-01-02 LAB — BLOOD GAS, ARTERIAL
ACID-BASE DEFICIT: 13.8 mmol/L — AB (ref 0.0–2.0)
Bicarbonate: 12.8 mEq/L — ABNORMAL LOW (ref 20.0–24.0)
DRAWN BY: 36277
FIO2: 0.6 %
LHR: 30 {breaths}/min
O2 SAT: 83.4 %
PATIENT TEMPERATURE: 98.6
PEEP: 5 cmH2O
PO2 ART: 58.8 mmHg — AB (ref 80.0–100.0)
TCO2: 13.9 mmol/L (ref 0–100)
VT: 550 mL
pCO2 arterial: 35.4 mmHg (ref 35.0–45.0)
pH, Arterial: 7.184 — CL (ref 7.350–7.450)

## 2014-01-02 LAB — BASIC METABOLIC PANEL
ANION GAP: 20 — AB (ref 5–15)
BUN: 41 mg/dL — ABNORMAL HIGH (ref 6–23)
CALCIUM: 13.2 mg/dL — AB (ref 8.4–10.5)
CO2: 35 meq/L — AB (ref 19–32)
Chloride: 104 mEq/L (ref 96–112)
Creatinine, Ser: 2.34 mg/dL — ABNORMAL HIGH (ref 0.50–1.35)
GFR calc Af Amer: 29 mL/min — ABNORMAL LOW (ref >90)
GFR calc non Af Amer: 25 mL/min — ABNORMAL LOW (ref >90)
Glucose, Bld: 108 mg/dL — ABNORMAL HIGH (ref 70–99)
Potassium: 7.1 mEq/L (ref 3.7–5.3)
SODIUM: 159 meq/L — AB (ref 137–147)

## 2014-01-02 LAB — CBC
HCT: 30.4 % — ABNORMAL LOW (ref 39.0–52.0)
HEMOGLOBIN: 10 g/dL — AB (ref 13.0–17.0)
MCH: 28.7 pg (ref 26.0–34.0)
MCHC: 32.9 g/dL (ref 30.0–36.0)
MCV: 87.4 fL (ref 78.0–100.0)
Platelets: 58 10*3/uL — ABNORMAL LOW (ref 150–400)
RBC: 3.48 MIL/uL — AB (ref 4.22–5.81)
RDW: 14.9 % (ref 11.5–15.5)
WBC: 14.1 10*3/uL — AB (ref 4.0–10.5)

## 2014-01-02 LAB — MAGNESIUM: MAGNESIUM: 2.6 mg/dL — AB (ref 1.5–2.5)

## 2014-01-02 LAB — PHOSPHORUS: PHOSPHORUS: 7.4 mg/dL — AB (ref 2.3–4.6)

## 2014-01-02 MED ORDER — FENTANYL CITRATE 0.05 MG/ML IJ SOLN
INTRAMUSCULAR | Status: AC
  Filled 2014-01-02: qty 2

## 2014-01-02 MED ORDER — FENTANYL CITRATE 0.05 MG/ML IJ SOLN
25.0000 ug | INTRAMUSCULAR | Status: DC | PRN
  Administered 2014-01-02 (×2): 50 ug via INTRAVENOUS
  Filled 2014-01-02: qty 2

## 2014-01-02 MED ORDER — SODIUM BICARBONATE 8.4 % IV SOLN
INTRAVENOUS | Status: AC
  Filled 2014-01-02: qty 100

## 2014-01-02 MED ORDER — SODIUM BICARBONATE 8.4 % IV SOLN
100.0000 meq | Freq: Once | INTRAVENOUS | Status: AC
  Administered 2014-01-02: 100 meq via INTRAVENOUS
  Filled 2014-01-02: qty 50

## 2014-01-02 MED ORDER — EPINEPHRINE HCL 1 MG/ML IJ SOLN
0.5000 ug/min | INTRAVENOUS | Status: DC
  Filled 2014-01-02: qty 1

## 2014-01-02 MED ORDER — MAGNESIUM SULFATE 40 MG/ML IJ SOLN
INTRAMUSCULAR | Status: AC
  Filled 2014-01-02: qty 50

## 2014-01-02 MED FILL — Medication: Qty: 1 | Status: AC

## 2014-01-05 NOTE — Discharge Summary (Signed)
Name: Adrian Jensen MRN: [id number] DOB: Jun 16, 1932  PCCM DEATH NOTE  Time of death:  January 04, 2014  3:22 AM  Cause of death: Acute myocardial infarction  Discharge diagnoses: Acute hypercarbic / hypoxemic respiratory failure Acute pulmonary edema  Possible aspiration pneumonia / pneumonitis / ARDS VF cardiac arrest Acute systolic congestive heart failure  Cardiogenic shock NSTEMI AKI with oliguria  Hypokalemia  Hypomagnesemia Anemia  Thrombocytopenia Hyperglycemia  Hypothyroidism  Brief hospital course: 80 yo with CAD admitted after VF arrest and ROSC after about 40 minutes of ACLS.  Course was complicated by multisystem organ failure and repeated in-hospital cardiac arrest.  Resuscitation was unsuccessfully.   Doree Fudge, MD [facility] Pager: [phone]

## 2014-01-26 NOTE — Progress Notes (Signed)
Wasted 60 of fentanyl 10 mcg/ml and 20 of versed 1mg /ml in sink. Witnessed by Julien Girt, RN and [name], RN.

## 2014-01-26 NOTE — Code Documentation (Signed)
  Patient Name: Adrian Jensen   MRN: [id number]   Date of Birth/ Sex: 05/29/1932 , male      Admission Date: 12/27/2013  Attending Provider: Doree Fudge*  Primary Diagnosis: cpr   Indication:   Pt s/p VF arrest under hypothermia protocol. Code blue was called. At the time of arrival on scene, ACLS protocol was underway. IMTS code team responded and code was subsequently run by Angelica Ran, PCCM fellow. Multiple rounds of CPR with numerous ROSC and subsequent return to PEA with VF. After 31 minutes, family arrived with patient's wife and POA, at which time, in accordance with their request, the code was stopped.    Technical Description:  - CPR performance duration:  31  minutes  - Was defibrillation or cardioversion used? Yes   - Was external pacer placed? No  - Was patient intubated pre/post CPR? Yes   Medications Administered: Y = Yes; Blank = No Amiodarone    Atropine    Calcium  y  Epinephrine  y  Lidocaine    Magnesium  y  Norepinephrine    Phenylephrine    Sodium bicarbonate  y  Vasopressin    Other    Post CPR evaluation:  - Final Status - Was patient successfully resuscitated ? No   Miscellaneous Information:  - Time of death:  42  AM  - Primary team notified?  Yes  - Family Notified? Yes     Family notified and responded to scene where patient's wife and POA requested that resuscitation be stopped.     Luan Moore, MD   2014/01/17, 3:23 AM

## 2014-01-26 NOTE — Progress Notes (Signed)
[facility] Progress Note Patient Name: Adrian Jensen DOB: [birth year] MRN: [id number]   Date of Service  01-08-14  HPI/Events of Note  Patient has not have ABG in 24 to 48 hours.  On bicarb gtt and O2 sat not picking up adequately. ABG: 7.18/35/59/13  eICU Interventions  Plan: 2 amps of bicarb IVP Increase Bicarb gtt Increase RR on vent to 35 Increase peep to 8 Repeat ABG at 4am.      Intervention Category Major Interventions: Acid-Base disturbance - evaluation and management  DETERDING,ELIZABETH 01/08/14, 2:12 AM

## 2014-01-26 NOTE — Progress Notes (Signed)
Patient started to brady down and went into pulseless asystole. Code Blue activated. See code blue documentation.

## 2014-01-26 NOTE — Care Management (Signed)
Chaplain called to 2H09. Offered comfort care to family. Prayed with family that was gathered in waiting area.   Chaplain Almira Bar

## 2014-01-26 NOTE — Progress Notes (Signed)
Change per md verbal order

## 2014-01-26 DEATH — deceased

## 2014-04-06 ENCOUNTER — Encounter (HOSPITAL_COMMUNITY): Payer: Self-pay | Admitting: Cardiology

## 2015-08-27 IMAGING — CR DG CHEST 1V PORT
1 series · 1 of 1 positions shown · non-contrast
Comparison: 12/31/2013; 12/30/2013; 09/29/2013

CLINICAL DATA: Respiratory failure

EXAM:
PORTABLE CHEST - 1 VIEW

[AP]
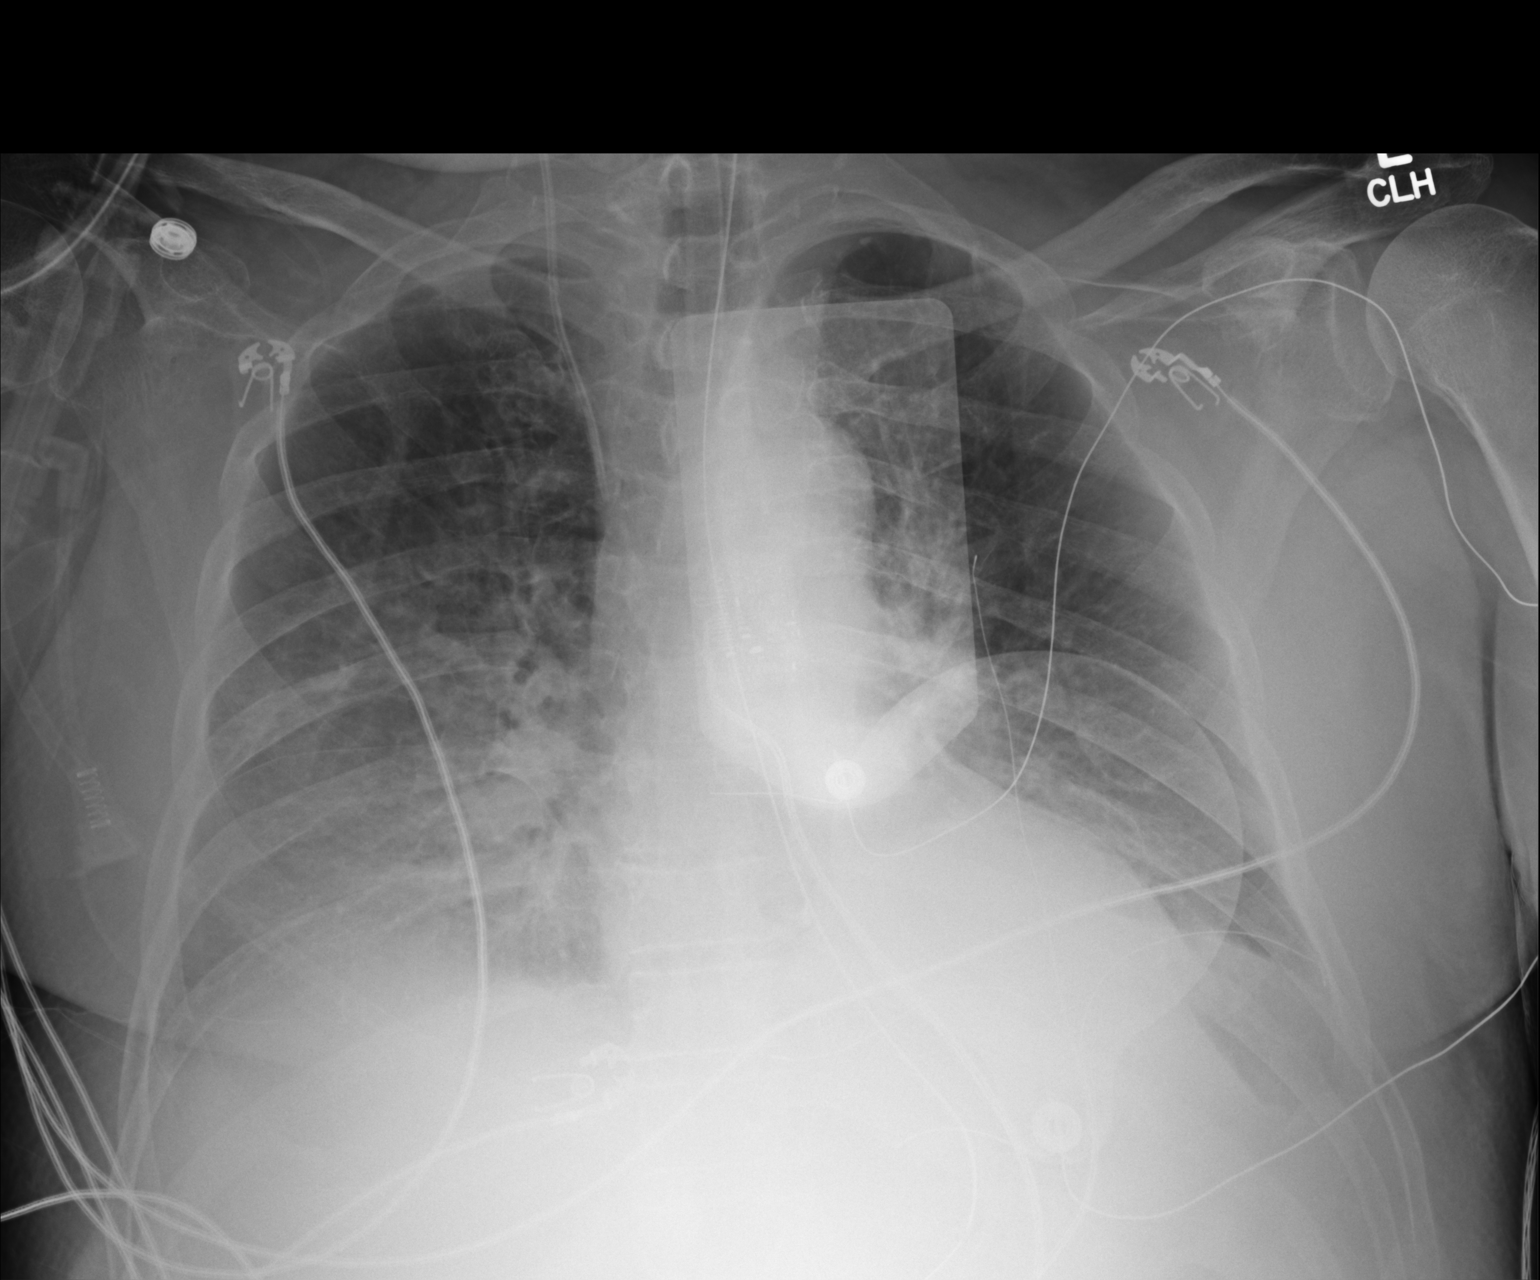

[1 of 1 positions shown; findings below may reference images not displayed]

FINDINGS: Grossly unchanged cardiac silhouette and mediastinal contours.
Stable positioning of support apparatus. No pneumothorax. Pulmonary
vasculature appears less distinct on present examination with
cephalization of flow. Interval development of small layering
bilateral effusions with associated worsening bilateral mid and
lower lung opacities, right greater than left. Grossly unchanged
bones.
IMPRESSION: 1.  Stable positioning of support apparatus.  No pneumothorax.
2. Worsening pulmonary edema, now with small layering right-sided
effusion worsening bibasilar opacities, right greater than left,
likely atelectasis.
